# Patient Record
Sex: Male | Born: 1975 | Race: Black or African American | Hispanic: No | Marital: Single | Smoking: Never smoker
Health system: Southern US, Community
[De-identification: ages and names within clinical notes are randomized; demographics above are authoritative.]

## PROBLEM LIST (undated history)

## (undated) DIAGNOSIS — K219 Gastro-esophageal reflux disease without esophagitis: Secondary | ICD-10-CM

## (undated) DIAGNOSIS — G4730 Sleep apnea, unspecified: Secondary | ICD-10-CM

## (undated) DIAGNOSIS — G4733 Obstructive sleep apnea (adult) (pediatric): Secondary | ICD-10-CM

## (undated) DIAGNOSIS — I10 Essential (primary) hypertension: Secondary | ICD-10-CM

## (undated) HISTORY — DX: Sleep apnea, unspecified: G47.30

## (undated) HISTORY — DX: Gastro-esophageal reflux disease without esophagitis: K21.9

---

## 2016-05-09 ENCOUNTER — Emergency Department (HOSPITAL_COMMUNITY)
Admission: EM | Admit: 2016-05-09 | Discharge: 2016-05-09 | Disposition: A | Discharge status: Home / Self Care | Payer: PRIVATE HEALTH INSURANCE | Attending: Emergency Medicine | Admitting: Emergency Medicine

## 2016-05-09 ENCOUNTER — Encounter (HOSPITAL_COMMUNITY): Payer: Self-pay | Admitting: Emergency Medicine

## 2016-05-09 DIAGNOSIS — J020 Streptococcal pharyngitis: Secondary | ICD-10-CM | POA: Insufficient documentation

## 2016-05-09 DIAGNOSIS — R52 Pain, unspecified: Secondary | ICD-10-CM

## 2016-05-09 DIAGNOSIS — I10 Essential (primary) hypertension: Secondary | ICD-10-CM | POA: Diagnosis not present

## 2016-05-09 DIAGNOSIS — M791 Myalgia: Secondary | ICD-10-CM | POA: Insufficient documentation

## 2016-05-09 DIAGNOSIS — R509 Fever, unspecified: Secondary | ICD-10-CM | POA: Diagnosis present

## 2016-05-09 HISTORY — DX: Essential (primary) hypertension: I10

## 2016-05-09 LAB — RAPID STREP SCREEN (MED CTR MEBANE ONLY): STREPTOCOCCUS, GROUP A SCREEN (DIRECT): POSITIVE — AB

## 2016-05-09 LAB — MONONUCLEOSIS SCREEN: Mono Screen: NEGATIVE

## 2016-05-09 MED ORDER — PENICILLIN G BENZATHINE 1200000 UNIT/2ML IM SUSP
1.2000 10*6.[IU] | Freq: Once | INTRAMUSCULAR | Status: AC
  Administered 2016-05-09: 1.2 10*6.[IU] via INTRAMUSCULAR
  Filled 2016-05-09: qty 2

## 2016-05-09 MED ORDER — KETOROLAC TROMETHAMINE 60 MG/2ML IM SOLN
60.0000 mg | Freq: Once | INTRAMUSCULAR | Status: AC
Start: 2016-05-09 — End: 2016-05-09
  Administered 2016-05-09: 60 mg via INTRAMUSCULAR
  Filled 2016-05-09: qty 2

## 2016-05-09 MED ORDER — NAPROXEN 500 MG PO TABS: 500.0000 mg | ORAL_TABLET | Freq: Two times a day (BID) | ORAL | Status: DC

## 2016-05-09 MED ORDER — ACETAMINOPHEN 500 MG PO TABS
500.0000 mg | ORAL_TABLET | Freq: Once | ORAL | Status: DC
  Filled 2016-05-09: qty 1

## 2016-05-09 MED ORDER — IBUPROFEN 200 MG PO TABS: 600.0000 mg | ORAL_TABLET | Freq: Once | ORAL | Status: DC

## 2016-05-09 NOTE — ED Notes (Signed)
Pt sts fever and generalized body aches with sore throat x 2 days

## 2016-05-09 NOTE — Discharge Instructions (Signed)
You have been seen today for a sore throat. Your strep analysis was positive. You were given an injection of penicillin here in the ED to treat this infection. Drink plenty of fluids and get plenty of rest. You should be drinking at least a liter of water an hour to stay hydrated. Ibuprofen or Tylenol for pain or fever. Warm liquids or Chloraseptic spray may help soothe the sore throat. Follow up with PCP as soon as possible. Refer to the resource phone number and website on this discharge paperwork. Return to ED should symptoms worsen.

## 2016-05-09 NOTE — ED Provider Notes (Signed)
CSN: [id number]     Arrival date & time 05/09/16  0849 History   First MD Initiated Contact with Patient 05/09/16 [phone]     Chief Complaint  Patient presents with  . Fever  . Generalized Body Aches     (Consider location/radiation/quality/duration/timing/severity/associated sxs/prior Treatment) HPI   Nathaniel Daugherty is a 40 y.o. male, with a history of Hypertension, presenting to the ED with subjective fever, sore throat, and generalized body aches since yesterday. Also complains of bilateral generalized headache, throbbing, moderate, nonradiating. Has taken DayQuil with some relief. Denies N/V, difficulty breathing or inability to swallow, abdominal pain, or any olther complaints.   Past Medical History  Diagnosis Date  . Hypertension    History reviewed. No pertinent past surgical history. History reviewed. No pertinent family history. Social History  Substance Use Topics  . Smoking status: Never Smoker   . Smokeless tobacco: None  . Alcohol Use: No    Review of Systems  Constitutional: Positive for fever (subjective) and chills.  HENT: Positive for sore throat.   Respiratory: Negative for cough and shortness of breath.   Cardiovascular: Negative for chest pain.  Gastrointestinal: Negative for nausea, vomiting, abdominal pain, diarrhea and constipation.  Musculoskeletal: Positive for myalgias (generalized).  Skin: Negative for color change and pallor.  Neurological: Positive for headaches. Negative for dizziness, syncope, weakness, light-headedness and numbness.  All other systems reviewed and are negative.    Allergies  Review of patient's allergies indicates no known allergies.  Home Medications   Prior to Admission medications   Medication Sig Start Date End Date Taking? Authorizing Provider  calcium carbonate (TUMS - DOSED IN MG ELEMENTAL CALCIUM) 500 MG chewable tablet Chew 2 tablets by mouth daily as needed for indigestion or heartburn.   Yes Historical  Provider, MD  Nutritional Supplements (COLD AND FLU PO) Take 1 tablet by mouth every 6 (six) hours as needed (fever).   Yes Historical Provider, MD  naproxen (NAPROSYN) 500 MG tablet Take 1 tablet (500 mg total) by mouth 2 (two) times daily. 05/09/16   Taleeya Blondin C Saniyya Gau, PA-C   BP 146/104 mmHg  Pulse 100  Temp(Src) 99.3 F (37.4 C) (Oral)  Resp 18  SpO2 97% Physical Exam  Constitutional: He is oriented to person, place, and time. He appears well-developed and well-nourished. No distress.  HENT:  Head: Normocephalic and atraumatic.  Eyes: Conjunctivae are normal.  Neck: Normal range of motion. Neck supple.  Tender cervical lymph nodes. Swallow is intact.  Cardiovascular: Normal rate, regular rhythm, normal heart sounds and intact distal pulses.   Pulmonary/Chest: Effort normal and breath sounds normal. No respiratory distress.  Abdominal: Soft. There is no tenderness. There is no guarding.  Musculoskeletal: He exhibits no edema.  Full ROM in all extremities and spine. No paraspinal tenderness.   Lymphadenopathy:    He has cervical adenopathy.  Neurological: He is alert and oriented to person, place, and time. He has normal reflexes.  No sensory deficits. Strength 5/5 in all extremities. No gait disturbance. Coordination intact. Cranial nerves III-XII grossly intact. No facial droop.   Skin: Skin is warm and dry. He is not diaphoretic.  Psychiatric: He has a normal mood and affect. His behavior is normal.  Nursing note and vitals reviewed.   ED Course  Procedures (including critical care time) Labs Review Labs Reviewed  RAPID STREP SCREEN (NOT AT [facility]) - Abnormal; Notable for the following:    Streptococcus, Group A Screen (Direct) POSITIVE (*)    All other  components within normal limits  MONONUCLEOSIS SCREEN    I have personally reviewed and evaluated these lab results as part of my medical decision-making.   EKG Interpretation None       Medications  acetaminophen (TYLENOL)  tablet 500 mg (500 mg Oral Refused 05/09/16 1221)  ketorolac (TORADOL) injection 60 mg (60 mg Intramuscular Given 05/09/16 1027)  penicillin g benzathine (BICILLIN LA) 1200000 UNIT/2ML injection 1.2 Million Units (1.2 Million Units Intramuscular Given 05/09/16 1221)    MDM   Final diagnoses:  Body aches  Strep throat    Nathaniel Daugherty presents with fever, body aches, and sore throat since yesterday.  Patient is nontoxic appearing, afebrile, not tachycardic upon my exam, not tachypneic, not hypotensive, maintains SPO2 of 97% on room air, and is in no apparent distress. Patient has no signs of sepsis or other serious or life-threatening condition. Patient able to pass an oral fluid challenge. Upon reevaluation, patient voices significant improvement following the Toradol, including decrease in the patient's throat pain, body aches, and relief of headache. Rapid strep is positive. Patient opted for IM penicillin. Home care and return precautions discussed. Patient voiced understanding of these instructions and is comfortable with discharge.  Patient's hypertension is noted. When patient was asked about this, he states that he moved to this area a year ago and has not yet established care with a PCP. Patient was previously prescribed clonidine, but has not taken this medication for at least a year. No symptoms of hypertensive emergency.   Filed Vitals:   05/09/16 0858 05/09/16 0930 05/09/16 0931 05/09/16 0945  BP: 155/115 158/112  146/104  Pulse: 107  98 100  Temp: 99.3 F (37.4 C)     TempSrc: Oral     Resp: 18   18  SpO2: 97%  97% 97%   Filed Vitals:   05/09/16 0931 05/09/16 0945 05/09/16 1145 05/09/16 1148  BP:  146/104 143/94 143/94  Pulse: 98 100 118 115  Temp:    100.5 F (38.1 C)  TempSrc:    Oral  Resp:  18  20  SpO2: 97% 97% 98% 97%     Anselm PancoastShawn C Tanish Prien, PA-C 05/09/16 1608  Marily MemosJason Mesner, MD 05/09/16 1702

## 2016-12-28 ENCOUNTER — Encounter (HOSPITAL_COMMUNITY): Payer: Self-pay | Admitting: Emergency Medicine

## 2016-12-28 ENCOUNTER — Emergency Department (HOSPITAL_COMMUNITY)
Admission: EM | Admit: 2016-12-28 | Discharge: 2016-12-28 | Disposition: A | Discharge status: Home / Self Care | Payer: Managed Care, Other (non HMO) | Attending: Emergency Medicine | Admitting: Emergency Medicine

## 2016-12-28 DIAGNOSIS — M7522 Bicipital tendinitis, left shoulder: Secondary | ICD-10-CM | POA: Diagnosis not present

## 2016-12-28 DIAGNOSIS — M25512 Pain in left shoulder: Secondary | ICD-10-CM | POA: Diagnosis present

## 2016-12-28 DIAGNOSIS — M7552 Bursitis of left shoulder: Secondary | ICD-10-CM | POA: Diagnosis not present

## 2016-12-28 DIAGNOSIS — I10 Essential (primary) hypertension: Secondary | ICD-10-CM | POA: Diagnosis not present

## 2016-12-28 MED ORDER — METHOCARBAMOL 500 MG PO TABS: 500.0000 mg | ORAL_TABLET | Freq: Two times a day (BID) | ORAL | 0 refills | Status: DC

## 2016-12-28 MED ORDER — NAPROXEN 500 MG PO TABS: 500.0000 mg | ORAL_TABLET | Freq: Two times a day (BID) | ORAL | 0 refills | Status: DC

## 2016-12-28 NOTE — ED Notes (Signed)
Applied sling to pts left arm.

## 2016-12-28 NOTE — ED Triage Notes (Signed)
PT did not take meds for BP today

## 2016-12-28 NOTE — ED Provider Notes (Signed)
[facility]-EMERGENCY DEPT Provider Note    By signing my name below, I, Earmon PhoenixJennifer Waddell, attest that this documentation has been prepared under the direction and in the presence of Sharen Hecklaudia Deatrice Spanbauer, PA-C. Electronically Signed: Earmon PhoenixJennifer Waddell, ED Scribe. 12/28/16. 10:49 AM.    History   Chief Complaint Chief Complaint  Patient presents with  . Shoulder Pain   The history is provided by the patient and medical records. No language interpreter was used.    Nathaniel Daugherty is a 41 y.o. male with PMHx of HTN who presents to the Emergency Department complaining of left shoulder tightness that began yesterday evening. He reports associated left sided neck soreness and tightness. Pt reports the pain starts in his left neck and radiates down to mid bicep of the LUE. He reports lifting things over his head frequently at work. He has taken Tylenol for pain relief and reports rubbing alcohol on it last night. Moving the LUE increases the pain. He denies alleviating factors. He denies numbness, tingling or weakness of the left extremity. He denies trauma, injury or fall. He is right hand dominant.   Past Medical History:  Diagnosis Date  . Hypertension     There are no active problems to display for this patient.   History reviewed. No pertinent surgical history.     Home Medications    Prior to Admission medications   Medication Sig Start Date End Date Taking? Authorizing Provider  calcium carbonate (TUMS - DOSED IN MG ELEMENTAL CALCIUM) 500 MG chewable tablet Chew 2 tablets by mouth daily as needed for indigestion or heartburn.    Historical Provider, MD  methocarbamol (ROBAXIN) 500 MG tablet Take 1 tablet (500 mg total) by mouth 2 (two) times daily. 12/28/16   Liberty Handylaudia J Eknoor Novack, PA-C  naproxen (NAPROSYN) 500 MG tablet Take 1 tablet (500 mg total) by mouth 2 (two) times daily. 12/28/16   Liberty Handylaudia J Jairus Tonne, PA-C  Nutritional Supplements (COLD AND FLU PO) Take 1 tablet by mouth every 6 (six)  hours as needed (fever).    Historical Provider, MD    Family History No family history on file.  Social History Social History  Substance Use Topics  . Smoking status: Never Smoker  . Smokeless tobacco: Never Used  . Alcohol use No     Allergies   Patient has no known allergies.   Review of Systems Review of Systems  Constitutional: Negative for chills and fever.  HENT: Negative for congestion and sore throat.   Eyes: Negative for visual disturbance.  Respiratory: Negative for cough, chest tightness and shortness of breath.   Cardiovascular: Negative for chest pain.  Gastrointestinal: Negative for abdominal pain, constipation, diarrhea, nausea and vomiting.  Genitourinary: Negative for decreased urine volume and difficulty urinating.  Musculoskeletal: Positive for arthralgias and myalgias. Negative for joint swelling.  Skin: Negative for rash.  Neurological: Negative for dizziness, weakness, light-headedness, numbness and headaches.     Physical Exam Updated Vital Signs BP (!) 162/102 (BP Location: Right Arm)   Pulse 67   Temp 98.7 F (37.1 C) (Oral)   Resp 16   SpO2 97%   Physical Exam  Constitutional: He is oriented to person, place, and time. He appears well-developed and well-nourished. No distress.  NAD.  HENT:  Head: Normocephalic and atraumatic.  Right Ear: External ear normal.  Left Ear: External ear normal.  Moist mucous membranes.  No nasal mucosa edema. Oropharynx and tonsils pink without erythema, edema, exudates or lesions.  Uvula midline. No trismus.  Eyes: Conjunctivae are normal. No scleral icterus.  Neck: Normal range of motion. Neck supple. No JVD present.  Cardiovascular: Normal rate and intact distal pulses.   Pulmonary/Chest: Effort normal. No respiratory distress.  Musculoskeletal: Normal range of motion. He exhibits tenderness. He exhibits no edema or deformity.  Increased trapezial muscular tone bilaterally.  No tenderness at [facility]  joint, clavicle.  Tenderness at left [facility] joint, left deltoid, left bicep tendon groove and subacromial bursa. Positive Hawkins test. Positive Neer test. Positive Speed's test.  Positive empty can test. Negative lift off test. Negative arm drop test. Negative O'brien test. Full ROM of shoulder with reported pain with flexion past shoulder level and abduction.  Neurological: He is alert and oriented to person, place, and time.  5/5 strength with shoulder raise, abduction and adduction, bilaterally.  5/5 strength with elbow flexion and extension, bilaterally.  5/5 strength with wrist flexion and extension.  5/5 strength with finger abduction 2/4 biceps, triceps and BR DTR bilaterally. Good pincer and hand grip bilaterally.  Sensation to light touch intact in median, ulnar and radial nerve distribution, bilaterally.   Skin: Skin is warm and dry. Capillary refill takes less than 2 seconds.  Psychiatric: He has a normal mood and affect. His behavior is normal. Judgment and thought content normal.  Nursing note and vitals reviewed.    ED Treatments / Results  DIAGNOSTIC STUDIES: Oxygen Saturation is 97% on RA, normal by my interpretation.   COORDINATION OF CARE: 10:41 AM- Will provide work note and sling. Encourage pt to rest and ice the left shoulder. Will prescribe muscle relaxer and encouraged NSAID. Pt verbalizes understanding and agrees to plan.  Medications - No data to display  Labs (all labs ordered are listed, but only abnormal results are displayed) Labs Reviewed - No data to display  EKG  EKG Interpretation None       Radiology No results found.  Procedures Procedures (including critical care time)  Medications Ordered in ED Medications - No data to display   Initial Impression / Assessment and Plan / ED Course  I have reviewed the triage vital signs and the nursing notes.  Pertinent labs & imaging results that were available during my care of the patient were  reviewed by me and considered in my medical decision making (see chart for details).     Patient here for left shoulder tightness that began yesterday evening. Informed pt that imaging is not indicated at this time due to there being no trauma, injury or fall.  Doubt bony injury or dislocation.  Exam remarkable for tenderness at left [facility] joint, left deltoid, left bicep tendon groove and subacromial bursa. Left shoulder remarkable for Positive Hawkins test. Positive Neer test. Positive Speed's test.  Positive empty can test. Negative lift off test. Negative arm drop test. Negative O'brien test. Full ROM of shoulder with reported pain with flexion past shoulder level and abduction. Explained to pt that his symptoms are likely due to a subacromial bursitis and/or possibly bicep tendonitis. Pt advised to follow up with orthopedics for continued symptoms. Conservative therapy recommended and discussed which includes resting, immobilization and icing the area. Will provide pt with a sling and prescription for muscle relaxer and NSAID. Patient will be discharged home & is agreeable with above plan. Returns precautions discussed. Pt appears safe for discharge.  I personally performed the services described in this documentation, which was scribed in my presence. The recorded information has been reviewed and is accurate.   Final Clinical  Impressions(s) / ED Diagnoses   Final diagnoses:  Acute pain of left shoulder  Biceps tendinitis of left upper extremity  Subacromial bursitis of left shoulder joint    New Prescriptions Discharge Medication List as of 12/28/2016 11:02 AM       Liberty Handy, PA-C 12/28/16 1220    Doug Sou, MD 12/28/16 1717

## 2016-12-28 NOTE — ED Triage Notes (Signed)
Pt had full range of motion of Lt shoulder while removing his jacket. Pt denies any injury ,fallsand he does not lift heavy objects at work. Pt also reports does not do any weight lifting.

## 2016-12-28 NOTE — Discharge Instructions (Signed)
Your symptoms are most consistent with subacromial bursitis (inflammation of bursa of shoulder) or bicep tendonitis (inflammation of bicep tendon).  This occurs due to repetitive over head arm motion.  The most important initial treatment is to decrease inflammation: take naproxen twice a day, ice, rest.  After 2-3 days of rest, you may slowly transition to daily activities, but you should still avoid overhead arm motions for 2-3 weeks to prevent further inflammation of the joint. You may use your sling to assist you in resting your joint.   Follow up with orthopedist in 2-4 weeks for further discussion of your symptoms and outpatient imaging and further treatment. Please follow up with orthopedist if your shoulder pain does not improve after 2 weeks of naproxen, ice and activity modification  Return to emergency department if you develop left hand numbness, weakness, tingling.

## 2016-12-28 NOTE — ED Triage Notes (Signed)
Pt. Stated, My left shoulder started hurting yesterday. No injury.

## 2017-12-09 ENCOUNTER — Other Ambulatory Visit: Payer: Self-pay

## 2017-12-09 ENCOUNTER — Emergency Department (HOSPITAL_COMMUNITY)
Admission: EM | Admit: 2017-12-09 | Discharge: 2017-12-09 | Discharge status: Against Medical Advice | Payer: PRIVATE HEALTH INSURANCE

## 2019-01-06 ENCOUNTER — Emergency Department (HOSPITAL_COMMUNITY)
Admission: EM | Admit: 2019-01-06 | Discharge: 2019-01-06 | Disposition: A | Discharge status: Home / Self Care | Payer: PRIVATE HEALTH INSURANCE | Attending: Emergency Medicine | Admitting: Emergency Medicine

## 2019-01-06 ENCOUNTER — Other Ambulatory Visit: Payer: Self-pay

## 2019-01-06 ENCOUNTER — Encounter (HOSPITAL_COMMUNITY): Payer: Self-pay

## 2019-01-06 DIAGNOSIS — I10 Essential (primary) hypertension: Secondary | ICD-10-CM | POA: Insufficient documentation

## 2019-01-06 DIAGNOSIS — Z79899 Other long term (current) drug therapy: Secondary | ICD-10-CM | POA: Insufficient documentation

## 2019-01-06 LAB — COMPREHENSIVE METABOLIC PANEL
ALBUMIN: 4.5 g/dL (ref 3.5–5.0)
ALT: 12 U/L (ref 0–44)
AST: 12 U/L — AB (ref 15–41)
Alkaline Phosphatase: 61 U/L (ref 38–126)
Anion gap: 5 (ref 5–15)
BUN: 14 mg/dL (ref 6–20)
CHLORIDE: 109 mmol/L (ref 98–111)
CO2: 27 mmol/L (ref 22–32)
CREATININE: 1.02 mg/dL (ref 0.61–1.24)
Calcium: 8.8 mg/dL — ABNORMAL LOW (ref 8.9–10.3)
GFR calc non Af Amer: >60 mL/min (ref >60)
Glucose, Bld: 100 mg/dL — ABNORMAL HIGH (ref 70–99)
Potassium: 3.8 mmol/L (ref 3.5–5.1)
SODIUM: 141 mmol/L (ref 135–145)
Total Bilirubin: 0.9 mg/dL (ref 0.3–1.2)
Total Protein: 7.8 g/dL (ref 6.5–8.1)

## 2019-01-06 LAB — CBC WITH DIFFERENTIAL/PLATELET
ABS IMMATURE GRANULOCYTES: 0.01 10*3/uL (ref 0.00–0.07)
BASOS ABS: 0.1 10*3/uL (ref 0.0–0.1)
BASOS PCT: 1 %
Eosinophils Absolute: 0.2 10*3/uL (ref 0.0–0.5)
Eosinophils Relative: 2 %
HCT: 49.6 % (ref 39.0–52.0)
Hemoglobin: 15.4 g/dL (ref 13.0–17.0)
IMMATURE GRANULOCYTES: 0 %
Lymphocytes Relative: 43 %
Lymphs Abs: 2.8 10*3/uL (ref 0.7–4.0)
MCH: 30.5 pg (ref 26.0–34.0)
MCHC: 31 g/dL (ref 30.0–36.0)
MCV: 98.2 fL (ref 80.0–100.0)
Monocytes Absolute: 0.6 10*3/uL (ref 0.1–1.0)
Monocytes Relative: 8 %
NEUTROS ABS: 3 10*3/uL (ref 1.7–7.7)
NEUTROS PCT: 46 %
PLATELETS: 250 10*3/uL (ref 150–400)
RBC: 5.05 MIL/uL (ref 4.22–5.81)
RDW: 12.4 % (ref 11.5–15.5)
WBC: 6.6 10*3/uL (ref 4.0–10.5)
nRBC: 0 % (ref 0.0–0.2)

## 2019-01-06 MED ORDER — HYDROCHLOROTHIAZIDE 12.5 MG PO CAPS
12.5000 mg | ORAL_CAPSULE | Freq: Once | ORAL | Status: AC
  Administered 2019-01-06: 12.5 mg via ORAL
  Filled 2019-01-06: qty 1

## 2019-01-06 MED ORDER — AMLODIPINE BESYLATE 5 MG PO TABS: 5.0000 mg | ORAL_TABLET | Freq: Every day | ORAL | 0 refills | Status: DC

## 2019-01-06 MED ORDER — AMLODIPINE BESYLATE 5 MG PO TABS
5.0000 mg | ORAL_TABLET | Freq: Once | ORAL | Status: AC
  Administered 2019-01-06: 5 mg via ORAL
  Filled 2019-01-06: qty 1

## 2019-01-06 MED ORDER — HYDROCHLOROTHIAZIDE 25 MG PO TABS: 25.0000 mg | ORAL_TABLET | Freq: Every day | ORAL | 0 refills | Status: DC

## 2019-01-06 NOTE — Discharge Instructions (Signed)
You have been diagnosed today with High Blood Pressure.  At this time there does not appear to be the presence of an emergent medical condition, however there is always the potential for conditions to change. Please read and follow the below instructions.  Please return to the Emergency Department immediately for any new or worsening symptoms. Please be sure to follow up with your Primary Care Provider within one week regarding your visit today; please call their office to schedule an appointment even if you are feeling better for a follow-up visit. You may begin taking the blood pressure medications amlodipine and hydrochlorothiazide as prescribed once daily to help control your blood pressure. You must establish a primary care provider for further evaluation and treatment of your blood pressure.  I recommend that you have your blood pressure rechecked this week at a primary care office.  I have referred you to [facility] wellness [facility] to establish a primary care provider.  Continued monitoring of your blood pressures and treatment is important to prevent heart attack, stroke, kidney disease and other dangerous diseases.  Get help right away if: You get a very bad headache. You start to feel confused. You feel weak or numb. You feel faint. You get very bad pain in your: Chest. Belly (abdomen). You throw up (vomit) more than once. You have trouble breathing.  Please read the additional information packets attached to your discharge summary.  Do not take your medicine if  develop an itchy rash, swelling in your mouth or lips, or difficulty breathing.

## 2019-01-06 NOTE — ED Provider Notes (Addendum)
[facility]-EMERGENCY DEPT Provider Note   CSN: [id number] Arrival date & time: 01/06/19  1058    History   Chief Complaint Chief Complaint  Patient presents with  . Hypertension    HPI Nathaniel Daugherty is a 43 y.o. male presenting today for hypertension.  Patient reports that [facility] to receive a [facility] for his work's insurance plan when they noticed that his blood pressure was elevated.  Patient without any symptoms or complaints and states that he is feeling well and normal today however he was strongly encouraged to present to the emergency department by [facility] employees for his asymptomatic hypertension.  Patient reports that he has had elevated blood pressure for most of his adult life and had been previously treated prior to moving to [place].  Patient reports that he moved to [place] 3 years ago and has not established a primary care provider or taken any of his blood pressure medications since that time.  Patient does not recall the name of his previous blood pressure medications.  Patient denies other complaints today.    HPI  Past Medical History:  Diagnosis Date  . Hypertension     There are no active problems to display for this patient.   History reviewed. No pertinent surgical history.      Home Medications    Prior to Admission medications   Medication Sig Start Date End Date Taking? Authorizing Provider  amLODipine (NORVASC) 5 MG tablet Take 1 tablet (5 mg total) by mouth daily. 01/06/19   Harlene Salts A, PA-C  hydrochlorothiazide (HYDRODIURIL) 25 MG tablet Take 1 tablet (25 mg total) by mouth daily. 01/06/19   Harlene Salts A, PA-C  methocarbamol (ROBAXIN) 500 MG tablet Take 1 tablet (500 mg total) by mouth 2 (two) times daily. Patient not taking: Reported on 01/06/2019 12/28/16   Liberty Handy, PA-C  naproxen (NAPROSYN) 500 MG tablet Take 1 tablet (500 mg total) by mouth 2  (two) times daily. Patient not taking: Reported on 01/06/2019 12/28/16   Jerrell Mylar    Family History History reviewed. No pertinent family history.  Social History Social History   Tobacco Use  . Smoking status: Never Smoker  . Smokeless tobacco: Never Used  Substance Use Topics  . Alcohol use: No  . Drug use: No     Allergies   Patient has no known allergies.   Review of Systems Review of Systems  Constitutional: Negative.  Negative for chills and fever.  Eyes: Negative.  Negative for visual disturbance.  Respiratory: Negative.  Negative for shortness of breath.   Cardiovascular: Negative.  Negative for chest pain.  Gastrointestinal: Negative.  Negative for abdominal pain, nausea and vomiting.  Genitourinary: Negative.   Musculoskeletal: Negative.  Negative for arthralgias and myalgias.  Neurological: Positive for headaches (Occasional). Negative for dizziness, syncope, weakness and numbness.  All other systems reviewed and are negative.  Physical Exam Updated Vital Signs BP (!) 157/86   Pulse 75   Temp 98.6 F (37 C) (Oral)   Resp 11   SpO2 100%   Physical Exam Constitutional:      General: He is not in acute distress.    Appearance: Normal appearance. He is well-developed. He is not ill-appearing or diaphoretic.  HENT:     Head: Normocephalic and atraumatic.     Jaw: There is normal jaw occlusion.     Right Ear: External ear normal.  Left Ear: External ear normal.     Nose: Nose normal.     Mouth/Throat:     Lips: Pink.     Mouth: Mucous membranes are moist.     Pharynx: Oropharynx is clear. Uvula midline.  Eyes:     General: Vision grossly intact. Gaze aligned appropriately.     Extraocular Movements: Extraocular movements intact.     Conjunctiva/sclera: Conjunctivae normal.     Pupils: Pupils are equal, round, and reactive to light.     Comments: Visual fields grossly intact bilaterally.  Neck:     Musculoskeletal: Full passive  range of motion without pain, normal range of motion and neck supple.     Trachea: Trachea and phonation normal. No tracheal deviation.  Cardiovascular:     Rate and Rhythm: Normal rate and regular rhythm.     Pulses:          Radial pulses are 2+ on the right side and 2+ on the left side.       Dorsalis pedis pulses are 2+ on the right side and 2+ on the left side.       Posterior tibial pulses are 2+ on the right side and 2+ on the left side.     Heart sounds: Normal heart sounds.  Pulmonary:     Effort: Pulmonary effort is normal. No respiratory distress.     Breath sounds: Normal breath sounds and air entry.  Chest:     Chest wall: No tenderness.  Abdominal:     General: There is no distension.     Palpations: Abdomen is soft.     Tenderness: There is no abdominal tenderness. There is no guarding or rebound.  Musculoskeletal: Normal range of motion.     Right lower leg: Normal. He exhibits no tenderness. No edema.     Left lower leg: Normal. He exhibits no tenderness. No edema.  Feet:     Right foot:     Protective Sensation: 3 sites tested. 3 sites sensed.     Left foot:     Protective Sensation: 3 sites tested. 3 sites sensed.  Skin:    General: Skin is warm and dry.     Capillary Refill: Capillary refill takes less than 2 seconds.  Neurological:     Mental Status: He is alert.     GCS: GCS eye subscore is 4. GCS verbal subscore is 5. GCS motor subscore is 6.     Comments: Mental Status: Alert, oriented, thought content appropriate, able to give a coherent history. Speech fluent without evidence of aphasia. Able to follow 2 step commands without difficulty. Cranial Nerves: II: Peripheral visual fields grossly normal, pupils equal, round, reactive to light III,IV, VI: ptosis not present, extra-ocular motions intact bilaterally V,VII: smile symmetric, eyebrows raise symmetric, facial light touch sensation equal VIII: hearing grossly normal to voice X: uvula elevates  symmetrically XI: bilateral shoulder shrug symmetric and strong XII: midline tongue extension without fassiculations Motor: Normal tone. 5/5 strength in upper and lower extremities bilaterally including strong and equal grip strength and dorsiflexion/plantar flexion Sensory: Sensation intact to light touch in all extremities.Negative Romberg.  Deep Tendon Reflexes: 2+ and symmetric in the biceps and patella Cerebellar: normal finger-to-nose with bilateral upper extremities. Normal heel-to -shin balance bilaterally of the lower extremity. No pronator drift.  Gait: normal gait and balance CV: distal pulses palpable throughout  Psychiatric:        Behavior: Behavior normal.    ED Treatments / Results  Labs (all labs ordered are listed, but only abnormal results are displayed) Labs Reviewed  COMPREHENSIVE METABOLIC PANEL - Abnormal; Notable for the following components:      Result Value   Glucose, Bld 100 (*)    Calcium 8.8 (*)    AST 12 (*)    All other components within normal limits  CBC WITH DIFFERENTIAL/PLATELET    EKG EKG Interpretation  Date/Time:  Wednesday January 06 2019 15:50:01 EST Ventricular Rate:  61 PR Interval:    QRS Duration: 90 QT Interval:  416 QTC Calculation: 419 R Axis:   38 Text Interpretation:  Sinus arrhythmia Probable left atrial enlargement Probable left ventricular hypertrophy No acute changes Confirmed by Derwood Kaplan [id number]) on 01/06/2019 5:13:12 PM   Radiology No results found.  Procedures Procedures (including critical care time)  Medications Ordered in ED Medications  amLODipine (NORVASC) tablet 5 mg (5 mg Oral Given 01/06/19 1544)  hydrochlorothiazide (MICROZIDE) capsule 12.5 mg (12.5 mg Oral Given 01/06/19 1735)     Initial Impression / Assessment and Plan / ED Course  I have reviewed the triage vital signs and the nursing notes.  Pertinent labs & imaging results that were available during my care of the patient were  reviewed by me and considered in my medical decision making (see chart for details).    44 year old male presenting today from urgent care clinic for hypertension.  Patient was there for his annual work physical with a noted him to be hypertensive.  Patient without complaints and states that he has been feeling well.  Review of symptoms reveals a patient has occasional headache however he is currently without headache.  Patient denies chest pain or shortness of breath.  No visual complaints.  Physical examination is reassuring and neuro examination is without deficit.  Case discussed with Dr. Estell Harpin who advises CBC, CMP and EKG.  Amlodipine 5 mg ordered. - CBC within normal limits CMP nonacute EKG without acute findings reviewed with Dr. Rhunette Croft. - Case discussed with Dr. Rhunette Croft who is now attending physician at shift change.  He agrees that patient may be discharged with outpatient follow-up at this time.  We plan to add hydrochlorothiazide 25 mg daily on top of patient's 5 mg of amlodipine.  We discussed plan of care with the patient and he is agreeable to this and to discharge at this time.  On reassessment he is without complaint, no headache, chest pain or shortness of breath and he states that he is feeling well.  Patient has been referred to [facility] for primary care provider.  Patient states that he plans to follow-up with a primary care provider.  At this time there does not appear to be any evidence of an acute emergency medical condition and the patient appears stable for discharge with appropriate outpatient follow up. Diagnosis was discussed with patient who verbalizes understanding of care plan and is agreeable to discharge. I have discussed return precautions with patient who verbalizes understanding of return precautions. Patient strongly encouraged to follow-up with their PCP. All questions answered.  Patient's case discussed with Dr. Rhunette Croft who agrees with  plan to discharge with follow-up.   Note: Portions of this report may have been transcribed using voice recognition software. Every effort was made to ensure accuracy; however, inadvertent computerized transcription errors may still be present. Final Clinical Impressions(s) / ED Diagnoses   Final diagnoses:  Asymptomatic hypertension    ED Discharge Orders         Ordered  amLODipine (NORVASC) 5 MG tablet  Daily     01/06/19 1800    hydrochlorothiazide (HYDRODIURIL) 25 MG tablet  Daily     01/06/19 1800           Bill Salinas, PA-C 01/06/19 1809    Bill Salinas, PA-C 01/06/19 1815    Derwood Kaplan, MD 01/07/19 [phone]

## 2019-01-06 NOTE — ED Triage Notes (Signed)
[facility]. Pt states that he was told to come here because BP was 170/130. Pt denies headache or any pain. Pt denies weakness/numbness. Pt presents with no neuro deficits. Pt reports that he has HTN and has not taken BP medication for about 3 years now. Pt reports not having a PCP here since he moved about 3 years ago.

## 2019-01-14 ENCOUNTER — Ambulatory Visit: Payer: Self-pay | Admitting: *Deleted

## 2019-01-14 NOTE — Telephone Encounter (Signed)
Pt calling with complaints of increased BP reading of 156/122 that was taken earlier today around 12 pm. Pt reports that BP while on the phone with triage nurse was 148/118. Pt denies having any symptoms at this time. Pt was seen in the ED on 2/26 and prescribed Amlodipine and Hydrochlorothiazide which he has been taking daily without missing any doses. Pt also complains of his throat itching and experiencing coughing with taking the medications but is unable to tell which medication is causing these symptoms due to taking them at the same time. No appt availability for the pt in the office on tomorrow. Pt advised to seek treatment in the ED/Urgent Care with worsening symptoms before scheduled appt on Monday, 01/18/19. Pt verbalized understanding.   Reason for Disposition . Systolic BP  >= 160 OR Diastolic >= 100  Answer Assessment - Initial Assessment Questions 1. BLOOD PRESSURE: "What is the blood pressure?" "Did you take at least two measurements 5 minutes apart?"     156/122 around 12 pm today and 148/118 while on the phone with triage nurse, [id number] 2. ONSET: "When did you take your blood pressure?"     Around 12 pm today and while on the phone with triage nurse 3. HOW: "How did you obtain the blood pressure?" (e.g., visiting nurse, automatic home BP monitor)     Automatic home monitor 4. HISTORY: "Do you have a history of high blood pressure?"     yes 5. MEDICATIONS: "Are you taking any medications for blood pressure?" "Have you missed any doses recently?"     Yes was given to him by the emergency room 6. OTHER SYMPTOMS: "Do you have any symptoms?" (e.g., headache, chest pain, blurred vision, difficulty breathing, weakness)     Sometimes shortness of breath  Protocols used: HIGH BLOOD PRESSURE-A-AH

## 2019-01-18 ENCOUNTER — Ambulatory Visit: Payer: No Typology Code available for payment source | Admitting: Physician Assistant

## 2019-01-18 ENCOUNTER — Encounter: Payer: Self-pay | Admitting: Physician Assistant

## 2019-01-18 VITALS — BP 148/100 | HR 88 | Temp 98.8°F | Ht 68.25 in | Wt 224.0 lb

## 2019-01-18 DIAGNOSIS — E669 Obesity, unspecified: Secondary | ICD-10-CM | POA: Diagnosis not present

## 2019-01-18 DIAGNOSIS — Z114 Encounter for screening for human immunodeficiency virus [HIV]: Secondary | ICD-10-CM | POA: Diagnosis not present

## 2019-01-18 DIAGNOSIS — R0681 Apnea, not elsewhere classified: Secondary | ICD-10-CM | POA: Diagnosis not present

## 2019-01-18 DIAGNOSIS — R252 Cramp and spasm: Secondary | ICD-10-CM

## 2019-01-18 DIAGNOSIS — I10 Essential (primary) hypertension: Secondary | ICD-10-CM

## 2019-01-18 LAB — BASIC METABOLIC PANEL
BUN: 21 mg/dL (ref 6–23)
CHLORIDE: 99 meq/L (ref 96–112)
CO2: 30 meq/L (ref 19–32)
CREATININE: 1.21 mg/dL (ref 0.40–1.50)
Calcium: 9.6 mg/dL (ref 8.4–10.5)
GFR: 80.25 mL/min (ref >60.00)
Glucose, Bld: 96 mg/dL (ref 70–99)
Potassium: 4.2 mEq/L (ref 3.5–5.1)
SODIUM: 136 meq/L (ref 135–145)

## 2019-01-18 MED ORDER — AMLODIPINE BESYLATE 10 MG PO TABS
10.0000 mg | ORAL_TABLET | Freq: Every day | ORAL | 0 refills | Status: DC
Start: 2019-01-18 — End: 2019-02-01

## 2019-01-18 MED ORDER — HYDROCHLOROTHIAZIDE 25 MG PO TABS: 25.0000 mg | ORAL_TABLET | Freq: Every day | ORAL | 0 refills | Status: DC

## 2019-01-18 NOTE — Patient Instructions (Addendum)
It was great to see you!  Increase Norvasc to 10 mg daily. Continue Hydrochlorothiazide 25 mg.  We will be in touch with lab results.  You will be contacted about your referral to the sleep study specialists.  Let's follow-up in 2 weeks, sooner if you have concerns.  Take care,  Jarold Motto PA-C

## 2019-01-18 NOTE — Progress Notes (Signed)
Nathaniel Daugherty is a 43 y.o. male here for a new problem.   History of Present Illness:   Chief Complaint  Patient presents with  . Establish Care  . Hypertension  . Shortness of Breath    When sleeping    HPI   HTN -- Has been dx several years ago.  Up until an ED visit at the end of February, he has been without his medications for quite some time.  He does not remember what he used to take.  The emergency department provider started him on Norvasc 5 mg and hydrochlorothiazide 25 mg daily.  Several family members have history of hypertension. Currently taking Norvasc 5 mg, HCTZ 25 mg. Patient denies chest pain, SOB, blurred vision, dizziness, unusual headaches, lower leg swelling. Patient is compliant with medication. Denies excessive caffeine intake, stimulant usage, excessive alcohol intake, or increase in salt consumption.  Patient does report that since starting the HCTZ and Norvasc regimen he has had some intermittent muscle cramps.  BP Readings from Last 3 Encounters:  01/18/19 (!) 148/100  01/06/19 (!) 157/86  12/28/16 (!) 157/105   Apnea --patient states that his girlfriend has told him several times that he has severe issues of gasping for air in the middle of sleeping and taking pauses of no breathing.  She states that this is getting worse with time.  Patient states his father has sleep apnea, however he is noncompliant with a CPAP.  Body mass index is 33.81 kg/m.   Past Medical History:  Diagnosis Date  . Hypertension      Social History   Socioeconomic History  . Marital status: Single    Spouse name: Not on file  . Number of children: 4  . Years of education: Not on file  . Highest education level: Not on file  Occupational History  . Not on file  Social Needs  . Financial resource strain: Not on file  . Food insecurity:    Worry: Not on file    Inability: Not on file  . Transportation needs:    Medical: Not on file    Non-medical: Not on file   Tobacco Use  . Smoking status: Never Smoker  . Smokeless tobacco: Never Used  Substance and Sexual Activity  . Alcohol use: No  . Drug use: No  . Sexual activity: Yes    Birth control/protection: Condom  Lifestyle  . Physical activity:    Days per week: Not on file    Minutes per session: Not on file  . Stress: Not on file  Relationships  . Social connections:    Talks on phone: Not on file    Gets together: Not on file    Attends religious service: Not on file    Active member of club or organization: Not on file    Attends meetings of clubs or organizations: Not on file    Relationship status: Not on file  . Intimate partner violence:    Fear of current or ex partner: Not on file    Emotionally abused: Not on file    Physically abused: Not on file    Forced sexual activity: Not on file  Other Topics Concern  . Not on file  Social History Narrative  . Not on file    History reviewed. No pertinent surgical history.  Family History  Problem Relation Age of Onset  . Prostate cancer Neg Hx   . Colon cancer Neg Hx     No Known  Allergies  Current Medications:   Current Outpatient Medications:  .  hydrochlorothiazide (HYDRODIURIL) 25 MG tablet, Take 1 tablet (25 mg total) by mouth daily., Disp: 30 tablet, Rfl: 0 .  amLODipine (NORVASC) 10 MG tablet, Take 1 tablet (10 mg total) by mouth daily., Disp: 30 tablet, Rfl: 0   Review of Systems:   Review of Systems  Constitutional: Negative for chills, fever, malaise/fatigue and weight loss.  Respiratory: Negative for shortness of breath.   Cardiovascular: Negative for chest pain, orthopnea, claudication and leg swelling.  Gastrointestinal: Negative for heartburn, nausea and vomiting.  Neurological: Negative for dizziness, tingling and headaches.    Vitals:   Vitals:   01/18/19 1100 01/18/19 1126  BP: (!) 138/98 (!) 148/100  Pulse: 88   Temp: 98.8 F (37.1 C)   TempSrc: Oral   SpO2: 98%   Weight: 224 lb (101.6  kg)   Height: 5' 8.25" (1.734 m)      Body mass index is 33.81 kg/m.  Physical Exam:   Physical Exam Vitals signs and nursing note reviewed.  Constitutional:      General: He is not in acute distress.    Appearance: He is well-developed. He is not ill-appearing or toxic-appearing.  Cardiovascular:     Rate and Rhythm: Normal rate and regular rhythm.     Pulses: Normal pulses.     Heart sounds: Normal heart sounds, S1 normal and S2 normal.     Comments: No LE edema Pulmonary:     Effort: Pulmonary effort is normal.     Breath sounds: Normal breath sounds.  Skin:    General: Skin is warm and dry.  Neurological:     Mental Status: He is alert.     GCS: GCS eye subscore is 4. GCS verbal subscore is 5. GCS motor subscore is 6.  Psychiatric:        Speech: Speech normal.        Behavior: Behavior normal. Behavior is cooperative.      Assessment and Plan:   Nur was seen today for establish care, hypertension and shortness of breath.  Diagnoses and all orders for this visit:  Muscle cramps We will check BMP, as he recently started HCTZ. -     Basic metabolic panel  Obesity, unspecified classification, unspecified obesity type, unspecified whether serious comorbidity present; Episode of apnea Refer for sleep study.  Screening for HIV (human immunodeficiency virus) -     HIV Antibody (routine testing w rflx)  Essential hypertension Uncontrolled.  Going to increase Norvasc from 5 to 10 mg daily.  Continue hydrochlorothiazide 25 mg daily.  Follow-up in 2 weeks, sooner if issues.  Referral for patient to have sleep study is prudent.  Other orders -     amLODipine (NORVASC) 10 MG tablet; Take 1 tablet (10 mg total) by mouth daily. -     hydrochlorothiazide (HYDRODIURIL) 25 MG tablet; Take 1 tablet (25 mg total) by mouth daily.  . Reviewed expectations re: course of current medical issues. . Discussed self-management of symptoms. . Outlined signs and symptoms  indicating need for more acute intervention. . Patient verbalized understanding and all questions were answered. . See orders for this visit as documented in the electronic medical record. . Patient received an After-Visit Summary.  Jarold Motto, PA-C

## 2019-01-19 LAB — HIV ANTIBODY (ROUTINE TESTING W REFLEX): HIV 1&2 Ab, 4th Generation: NONREACTIVE

## 2019-01-30 ENCOUNTER — Telehealth: Payer: Self-pay

## 2019-01-30 NOTE — Telephone Encounter (Signed)
Called patient he to change app due to new restrictions. Everything I have found says that you can't do visits on cell phone. He does not have access to computer at this time. I have verified his email and he  Will call if that changes.  His BP reading while on the phone was 140/105 pulse was 99. He states that this is his average readings. He normally takes his BP meds at 7pm. He does not take in the morning because he frequently for gets to take them when he tries. He has taken his bp meds every day.   Review: taking medications as instructed, no medication side effects noted, no TIAs, no chest pain on exertion, no dyspnea on exertion, no swelling of ankles. Smoker: No. He has had some cramp in legs recently. No changes in vision.  BP Readings from Last 3 Encounters:  01/18/19 (!) 148/100  01/06/19 (!) 157/86  12/28/16 (!) 157/105   Lab Results  Component Value Date   CREATININE 1.21 01/18/2019   CREATININE 1.02 01/06/2019     Not sure if this is ok to just call in 35mo of refills and have him move his app out or not.

## 2019-02-01 ENCOUNTER — Other Ambulatory Visit: Payer: Self-pay

## 2019-02-01 ENCOUNTER — Ambulatory Visit: Payer: No Typology Code available for payment source | Admitting: Physician Assistant

## 2019-02-01 ENCOUNTER — Encounter: Payer: Self-pay | Admitting: Physician Assistant

## 2019-02-01 VITALS — BP 124/90 | HR 90 | Temp 98.2°F | Ht 68.25 in | Wt 230.0 lb

## 2019-02-01 DIAGNOSIS — I10 Essential (primary) hypertension: Secondary | ICD-10-CM | POA: Diagnosis not present

## 2019-02-01 DIAGNOSIS — Z23 Encounter for immunization: Secondary | ICD-10-CM | POA: Diagnosis not present

## 2019-02-01 MED ORDER — HYDROCHLOROTHIAZIDE 25 MG PO TABS: 25.0000 mg | ORAL_TABLET | Freq: Every day | ORAL | 0 refills | Status: DC

## 2019-02-01 MED ORDER — AMLODIPINE BESYLATE 10 MG PO TABS: 10.0000 mg | ORAL_TABLET | Freq: Every day | ORAL | 0 refills | Status: DC

## 2019-02-01 NOTE — Progress Notes (Signed)
Nathaniel Daugherty is a 43 y.o. male is here to discuss:  I acted as a Neurosurgeon for [employer], PA-C Corky Mull, LPN  History of Present Illness:   Chief Complaint  Patient presents with   Hypertension    Currently on 10 mg Norvasc and 25 mg HCTZ.   Hypertension  This is a chronic problem. The current episode started 1 to 4 weeks ago (Pt here for follow up on blood pressure has been checking daily at home is averaging 140/101.). The problem is unchanged. The problem is uncontrolled. Pertinent negatives include no blurred vision, chest pain, headaches, palpitations, peripheral edema or shortness of breath. Risk factors for coronary artery disease include obesity and male gender. The current treatment provides no improvement.    There are no preventive care reminders to display for this patient.  Past Medical History:  Diagnosis Date   Hypertension      Social History   Socioeconomic History   Marital status: Single    Spouse name: Not on file   Number of children: 4   Years of education: Not on file   Highest education level: Not on file  Occupational History   Not on file  Social Needs   Financial resource strain: Not on file   Food insecurity:    Worry: Not on file    Inability: Not on file   Transportation needs:    Medical: Not on file    Non-medical: Not on file  Tobacco Use   Smoking status: Never Smoker   Smokeless tobacco: Never Used  Substance and Sexual Activity   Alcohol use: No   Drug use: No   Sexual activity: Yes    Birth control/protection: Condom  Lifestyle   Physical activity:    Days per week: Not on file    Minutes per session: Not on file   Stress: Not on file  Relationships   Social connections:    Talks on phone: Not on file    Gets together: Not on file    Attends religious service: Not on file    Active member of club or organization: Not on file    Attends meetings of clubs or organizations: Not on file     Relationship status: Not on file   Intimate partner violence:    Fear of current or ex partner: Not on file    Emotionally abused: Not on file    Physically abused: Not on file    Forced sexual activity: Not on file  Other Topics Concern   Not on file  Social History Narrative   Not on file    History reviewed. No pertinent surgical history.  Family History  Problem Relation Age of Onset   Prostate cancer Neg Hx    Colon cancer Neg Hx     PMHx, SurgHx, SocialHx, FamHx, Medications, and Allergies were reviewed in the Visit Navigator and updated as appropriate.   Patient Active Problem List   Diagnosis Date Noted   Obesity 01/18/2019   Essential hypertension 01/18/2019    Social History   Tobacco Use   Smoking status: Never Smoker   Smokeless tobacco: Never Used  Substance Use Topics   Alcohol use: No   Drug use: No    Current Medications and Allergies:    Current Outpatient Medications:    amLODipine (NORVASC) 10 MG tablet, Take 1 tablet (10 mg total) by mouth daily., Disp: 90 tablet, Rfl: 0   hydrochlorothiazide (HYDRODIURIL) 25 MG tablet, Take  1 tablet (25 mg total) by mouth daily., Disp: 90 tablet, Rfl: 0  No Known Allergies  Review of Systems   Review of Systems  Eyes: Negative for blurred vision.  Respiratory: Negative for shortness of breath.   Cardiovascular: Negative for chest pain and palpitations.  Neurological: Negative for headaches.    Vitals:   Vitals:   02/01/19 0940 02/01/19 0957  BP: 128/90 124/90  Pulse: 90   Temp: 98.2 F (36.8 C)   TempSrc: Oral   SpO2: 96%   Weight: 230 lb (104.3 kg)   Height: 5' 8.25" (1.734 m)      Body mass index is 34.72 kg/m.   Physical Exam:    Physical Exam Vitals signs and nursing note reviewed.  Constitutional:      General: He is not in acute distress.    Appearance: He is well-developed. He is not ill-appearing or toxic-appearing.  Cardiovascular:     Rate and Rhythm:  Normal rate and regular rhythm.     Pulses: Normal pulses.     Heart sounds: Normal heart sounds, S1 normal and S2 normal.     Comments: No LE edema Pulmonary:     Effort: Pulmonary effort is normal.     Breath sounds: Normal breath sounds.  Skin:    General: Skin is warm and dry.  Neurological:     Mental Status: He is alert.     GCS: GCS eye subscore is 4. GCS verbal subscore is 5. GCS motor subscore is 6.  Psychiatric:        Speech: Speech normal.        Behavior: Behavior normal. Behavior is cooperative.      Assessment and Plan:    Michall was seen today for hypertension.  Diagnoses and all orders for this visit:  Essential hypertension Has appointment scheduled for next month for sleep study. Will have him continue current regimen for now, as systolic is well controlled in our office today. Follow-up in 6 weeks, sooner if issues.  Need for prophylactic vaccination with combined diphtheria-tetanus-pertussis (DTP) vaccine -     Tdap vaccine greater than or equal to 7yo IM  Other orders -     hydrochlorothiazide (HYDRODIURIL) 25 MG tablet; Take 1 tablet (25 mg total) by mouth daily. -     amLODipine (NORVASC) 10 MG tablet; Take 1 tablet (10 mg total) by mouth daily.     Reviewed expectations re: course of current medical issues.  Discussed self-management of symptoms.  Outlined signs and symptoms indicating need for more acute intervention.  Patient verbalized understanding and all questions were answered.  See orders for this visit as documented in the electronic medical record.  Patient received an After Visit Summary.  CMA or LPN served as scribe during this visit. History, Physical, and Plan performed by medical provider. The above documentation has been reviewed and is accurate and complete.   Jarold Motto, PA-C [place] [place] 02/01/2019  Follow-up: No follow-ups on file.

## 2019-02-01 NOTE — Telephone Encounter (Signed)
Patient contacted and is coming in for visit.

## 2019-02-01 NOTE — Patient Instructions (Signed)
It was great to see you!  Continue blood pressure medications.  You may need a large blood pressure cuff.  Continue with sleep study.  Take care,  Jarold Motto PA-C

## 2019-02-09 ENCOUNTER — Other Ambulatory Visit: Payer: Self-pay | Admitting: Physician Assistant

## 2019-03-09 ENCOUNTER — Telehealth: Payer: Self-pay | Admitting: Neurology

## 2019-03-09 NOTE — Telephone Encounter (Signed)
Due to current COVID 19 pandemic, our office is severely reducing in office visits, in order to minimize the risk to our patients and healthcare providers.  Pt understands that although there may be some limitations with this type of visit, we will take all precautions to reduce any security or privacy concerns.  Pt understands that this will be treated like an in office visit and we will file with pt's insurance, and there may be a patient responsible charge related to this service. Pt's email is [email or web address]. Pt understands that the nurse will be calling to go over pt's chart.

## 2019-03-10 ENCOUNTER — Encounter: Payer: Self-pay | Admitting: Neurology

## 2019-03-10 NOTE — Telephone Encounter (Signed)
I called pt. Pt's meds, allergies, and PMH were updated.  Pt has never had a sleep study but does endorse snoring.  Pt was instructed on how to measure his neck size prior to his appt.  Pt's weight is 221 lbs and he is 5'5.  Epworth Sleepiness Scale 0= would never doze 1= slight chance of dozing 2= moderate chance of dozing 3= high chance of dozing  Sitting and reading: 3 Watching TV: 3 Sitting inactive in a public place (ex. Theater or meeting): 3 As a passenger in a car for an hour without a break: 3 Lying down to rest in the afternoon: 3 Sitting and talking to someone: 3 Sitting quietly after lunch (no alcohol): 3 In a car, while stopped in traffic: 3 Total: 24  FSS: 50

## 2019-03-11 ENCOUNTER — Other Ambulatory Visit: Payer: Self-pay

## 2019-03-11 ENCOUNTER — Encounter: Payer: Self-pay | Admitting: Neurology

## 2019-03-11 ENCOUNTER — Ambulatory Visit (INDEPENDENT_AMBULATORY_CARE_PROVIDER_SITE_OTHER): Payer: No Typology Code available for payment source | Admitting: Neurology

## 2019-03-11 DIAGNOSIS — E669 Obesity, unspecified: Secondary | ICD-10-CM | POA: Diagnosis not present

## 2019-03-11 DIAGNOSIS — R400 Somnolence: Secondary | ICD-10-CM | POA: Diagnosis not present

## 2019-03-11 DIAGNOSIS — Z9189 Other specified personal risk factors, not elsewhere classified: Secondary | ICD-10-CM

## 2019-03-11 DIAGNOSIS — R0681 Apnea, not elsewhere classified: Secondary | ICD-10-CM | POA: Diagnosis not present

## 2019-03-11 DIAGNOSIS — Z820 Family history of epilepsy and other diseases of the nervous system: Secondary | ICD-10-CM

## 2019-03-11 NOTE — Patient Instructions (Signed)
Given verbally, during today's virtual video-based encounter, with verbal feedback received.   

## 2019-03-11 NOTE — Progress Notes (Signed)
Huston Foley, MD, PhD [facility] [address] [address] [place], [place] [address]   Virtual Visit via Video Note on 03/11/2019:  I connected with Nathaniel Daugherty on 03/11/19 at  3:30 PM EDT by a video enabled telemedicine application and verified that I am speaking with the correct person using two identifiers.   I discussed the limitations of evaluation and management by telemedicine and the availability of in person appointments. The patient expressed understanding and agreed to proceed.  History of Present Illness: Nathaniel Daugherty is a 43 year old right-handed man with an underlying medical history of hypertension and obesity, with whom I am conducting a virtual, video based new patient visit via [email or web address] in lieu of a face-to-face visit for evaluation of his sleep disorder, in particular, evaluation for underlying obstructive sleep apnea. The patient is unaccompanied today and joins via cell phone from home, I am in my office. He is referred by his primary care PA, Nathaniel Daugherty, and I reviewed her note from 02/01/2019.   His Epworth sleepiness score is 24 out of 24, fatigue score is 15 out of 63. He admits to being sleepy for the past 3 or 4 years. He admits to dozing off while driving and even while at work. He reports no are accidents or problems at work. 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 Bedtime is generally around 8 and rise time between 3:30 and 4. He denies night to night nocturia or recurrent morning headaches recently. He believes his father has sleep apnea but is not sure if he has a CPAP machine. He has woken up with a sense of gasping for air and has been witnessed pauses in his breathing by his fiance. He lives with his fiance and fiancs young daughter. They have no pets in the household. He is a nonsmoker and drinks caffeine occasionally in the form of tea and has cut down on sodas. He drinks  alcohol occasionally. He has a TV in the bedroom which tends to stay on all night. His weight has been fluctuating. He is trying to work on weight loss.   His Past Medical History Is Significant For: Past Medical History:  Diagnosis Date   Hypertension     His Past Surgical History Is Significant For: No past surgical history on file.  His Family History Is Significant For: Family History  Problem Relation Age of Onset   Hypertension Mother    Hypertension Father    Prostate cancer Neg Hx    Colon cancer Neg Hx     His Social History Is Significant For: Social History   Socioeconomic History   Marital status: Single    Spouse name: Not on file   Number of children: 4   Years of education: Not on file   Highest education level: Not on file  Occupational History   Not on file  Social Needs   Financial resource strain: Not on file   Food insecurity:    Worry: Not on file    Inability: Not on file   Transportation needs:    Medical: Not on file    Non-medical: Not on file  Tobacco Use   Smoking status: Never Smoker   Smokeless tobacco: Never Used  Substance and Sexual Activity   Alcohol use: No   Drug use: No   Sexual activity: Yes    Birth control/protection: Condom  Lifestyle   Physical activity:  Days per week: Not on file    Minutes per session: Not on file   Stress: Not on file  Relationships   Social connections:    Talks on phone: Not on file    Gets together: Not on file    Attends religious service: Not on file    Active member of club or organization: Not on file    Attends meetings of clubs or organizations: Not on file    Relationship status: Not on file  Other Topics Concern   Not on file  Social History Narrative   Not on file    His Allergies Are:  No Known Allergies:   His Current Medications Are:  Outpatient Encounter Medications as of 03/11/2019  Medication Sig   amLODipine (NORVASC) 10 MG tablet TAKE 1  TABLET BY MOUTH EVERY DAY   hydrochlorothiazide (HYDRODIURIL) 25 MG tablet Take 1 tablet (25 mg total) by mouth daily.   No facility-administered encounter medications on file as of 03/11/2019.   :   Review of Systems:  Out of a complete 14 point review of systems, all are reviewed and negative with the exception of these symptoms as listed below:  Observations/Objective: His most recent vital signs available for my review of in the chart are from 02/01/2019: Blood pressure 124/90, pulse 90, temperature 98.2, weight 230 pounds for BMI of 34.72.   His most recent weight at home by self report is 225 pounds, neck circumference by self-report was 16 inches.on examination he is pleasant, conversant, in no acute distress. He wears corrective eyeglasses. Extraocular movements are preserved with no gaze limitation, no nystagmus noted. Face is symmetric with normal facial animation noted. Hearing is grossly intact.airway examination reveals moderate airway crowding secondary to thicker tongue, large uvula, smaller airway entry, tonsils in place. Mallampati is class II. He has no obvious overbite, perhaps a tiny underbite even, tongue protrudes centrally and palate elevates symmetrically. Shoulder height is equal. He stands without difficulty, Romberg is negative. His walking is unremarkable. Motor exam reveals normal bulk, he has no drift, no postural or action tremor. On finger to nose testing for cerebellar testing he has no dysmetria or intention tremor.   Assessment and Plan: Nathaniel Daugherty is a 43 year old right-handed man with an underlying medical history of hypertension and obesity, with whom I am conducting a virtual, video based new patient visit via Webex in lieu of a face-to-face visit for evaluation of an underlying organic sleep disorder, in particular, concern for obstructive sleep apnea. The patient's medical history and physical exam (albeit limited with current video-based  evaluation) are concerning for a diagnosis of obstructive sleep apnea. I discussed with the patient the diagnosis of OSA, its prognosis and treatment options. I explained in particular the risks and ramifications of untreated moderate to severe OSA, especially with respect to developing cardiovascular disease down the Road, including congestive heart failure, difficult to treat hypertension, cardiac arrhythmias, or stroke. Even type 2 diabetes has, in part, been linked to untreated OSA. Symptoms of untreated OSA may include daytime sleepiness, memory problems, mood irritability and mood disorder such as depression and anxiety, lack of energy, as well as recurrent headaches, especially morning headaches. We talked about the importance of weight control. We talked about the importance of maintaining good sleep hygiene. 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 I recommended the following at this time: home sleep test, vs. Laboratory tended sleep study. He would prefer to come in for a sleep study but is reminded that currently we are not running any sleep studies in our lab and there is a delay until we can open the lab back up safely for our patients. We will check with his insurance as well. I explained the sleep test procedure to the patient and also outlined possible treatment options of OSA, including the use of a custom-made dental device (which would require a referral to a specialist dentist), upper airway surgical options, (such as UPPP, which would involve a referral to an ENT). I also explained the CPAP vs. AutoPAP treatment option to the patient, who indicated that he would be willing to try CPAP if the need arises. I answered all his questions today and the patient was in agreement. I plan to see the patient back  after the sleep study is completed and encouraged him to call with any interim questions, concerns, problems or updates.   Huston Foley, MD, PhD    Follow Up Instructions: 1. Lab attended sleep study or if needed, HST, sleep lab staff will reach out to patient to arrange for either sending the unit to home address or a "drive by pickup" and for tutorial, making sure patient is comfortable with the unit and usage, and return of equipment, if necessary.  2. Consider AutoPap therapy, if home sleep test positive for obstructive sleep apnea, patient agreeable. 3. We talked about alternative treatment options and current limitations, due to virus pandemic.  4. Follow-up after starting AutoPap therapy, follow-up to be scheduled according to set-up date, typically within 31 to 89 days post treatment start. 5. Pursue healthy lifestyle, good sleep hygiene, healthy weight. 6. Call for any interim questions or concerns.    I discussed the assessment and treatment plan with the patient. The patient was provided an opportunity to ask questions and all were answered. The patient agreed with the plan and demonstrated an understanding of the instructions.   The patient was advised to call back or seek an in-person evaluation if the symptoms worsen or if the condition fails to improve as anticipated.   I provided 30 minutes of non-face-to-face time during this encounter.   Huston Foley, MD

## 2019-03-15 ENCOUNTER — Encounter: Payer: Self-pay | Admitting: Physician Assistant

## 2019-03-15 ENCOUNTER — Ambulatory Visit (INDEPENDENT_AMBULATORY_CARE_PROVIDER_SITE_OTHER): Payer: No Typology Code available for payment source | Admitting: Physician Assistant

## 2019-03-15 ENCOUNTER — Ambulatory Visit: Payer: No Typology Code available for payment source | Admitting: Physician Assistant

## 2019-03-15 VITALS — BP 140/97

## 2019-03-15 DIAGNOSIS — I10 Essential (primary) hypertension: Secondary | ICD-10-CM | POA: Diagnosis not present

## 2019-03-15 NOTE — Progress Notes (Signed)
   Virtual Visit via Video   I connected with Nathaniel Daugherty on 03/15/19 at 10:20 AM EDT by a video enabled telemedicine application and verified that I am speaking with the correct person using two identifiers. Location patient: Home Location provider: [facility], Office Persons participating in the virtual visit: Abdulkarim Romar, [place], [name]   I discussed the limitations of evaluation and management by telemedicine and the availability of in person appointments. The patient expressed understanding and agreed to proceed.  Subjective:   HPI:  Hypertension Pt for follow up on blood pressure today. Pt checks blood pressure at home daily. BP averaging 118-140 systolic and 89-97 diastolic. Pt denies headaches, dizziness, blurred vision, chest pain , SOB or LLE. He does note that the blood pressure medication makes him urinate more. He recently had a virtual visit with the neurologist to discuss sleep apnea and he will plan to have a sleep study soon pending his insurance.  ROS: See pertinent positives and negatives per HPI.  Patient Active Problem List   Diagnosis Date Noted  . Obesity 01/18/2019  . Essential hypertension 01/18/2019    Social History   Tobacco Use  . Smoking status: Never Smoker  . Smokeless tobacco: Never Used  Substance Use Topics  . Alcohol use: No    Current Outpatient Medications:  .  amLODipine (NORVASC) 10 MG tablet, TAKE 1 TABLET BY MOUTH EVERY DAY, Disp: 30 tablet, Rfl: 0 .  hydrochlorothiazide (HYDRODIURIL) 25 MG tablet, Take 1 tablet (25 mg total) by mouth daily., Disp: 90 tablet, Rfl: 0  No Known Allergies  Objective:   VITALS: Per patient if applicable, see vitals. GENERAL: Alert, appears well and in no acute distress. HEENT: Atraumatic, conjunctiva clear, no obvious abnormalities on inspection of external nose and ears. NECK: Normal movements of the head and neck. CARDIOPULMONARY: No increased WOB. Speaking in clear sentences. I:E  ratio WNL.  MS: Moves all visible extremities without noticeable abnormality. PSYCH: Pleasant and cooperative, well-groomed. Speech normal rate and rhythm. Affect is appropriate. Insight and judgement are appropriate. Attention is focused, linear, and appropriate.  NEURO: CN grossly intact. Oriented as arrived to appointment on time with no prompting. Moves both UE equally.  SKIN: No obvious lesions, wounds, erythema, or cyanosis noted on face or hands.  Assessment and Plan:   Merlin was seen today for hypertension.  Diagnoses and all orders for this visit:  Essential hypertension   Currently well controlled based on home readings. Will continue current regimen. Encouraged patient to get his sleep study to help with BP as well. Follow-up in 6 months, sooner if concerns.  . Reviewed expectations re: course of current medical issues. . Discussed self-management of symptoms. . Outlined signs and symptoms indicating need for more acute intervention. . Patient verbalized understanding and all questions were answered. Marland Kitchen Health Maintenance issues including appropriate healthy diet, exercise, and smoking avoidance were discussed with patient. . See orders for this visit as documented in the electronic medical record.  I discussed the assessment and treatment plan with the patient. The patient was provided an opportunity to ask questions and all were answered. The patient agreed with the plan and demonstrated an understanding of the instructions.   The patient was advised to call back or seek an in-person evaluation if the symptoms worsen or if the condition fails to improve as anticipated.    [place], [name] 03/15/2019

## 2019-03-31 ENCOUNTER — Other Ambulatory Visit: Payer: Self-pay

## 2019-03-31 ENCOUNTER — Encounter (HOSPITAL_COMMUNITY): Payer: Self-pay

## 2019-03-31 ENCOUNTER — Ambulatory Visit (HOSPITAL_COMMUNITY)
Admission: EM | Admit: 2019-03-31 | Discharge: 2019-03-31 | Disposition: A | Discharge status: Home / Self Care | Payer: No Typology Code available for payment source | Attending: Family Medicine | Admitting: Family Medicine

## 2019-03-31 DIAGNOSIS — I10 Essential (primary) hypertension: Secondary | ICD-10-CM

## 2019-03-31 DIAGNOSIS — Z202 Contact with and (suspected) exposure to infections with a predominantly sexual mode of transmission: Secondary | ICD-10-CM

## 2019-03-31 DIAGNOSIS — Z113 Encounter for screening for infections with a predominantly sexual mode of transmission: Secondary | ICD-10-CM | POA: Insufficient documentation

## 2019-03-31 LAB — POCT URINALYSIS DIP (DEVICE)
Bilirubin Urine: NEGATIVE
Glucose, UA: NEGATIVE mg/dL
Hgb urine dipstick: NEGATIVE
Ketones, ur: NEGATIVE mg/dL
Nitrite: NEGATIVE
Protein, ur: NEGATIVE mg/dL
Specific Gravity, Urine: >=1.03 (ref 1.005–1.030)
Urobilinogen, UA: 1 mg/dL (ref 0.0–1.0)
pH: 5.5 (ref 5.0–8.0)

## 2019-03-31 NOTE — ED Triage Notes (Signed)
Pt is here today wanting to get some STD screening.

## 2019-03-31 NOTE — Discharge Instructions (Addendum)
We are sending your urine for STD testing We will call with any positive results and treat as needed.

## 2019-04-01 LAB — URINE CYTOLOGY ANCILLARY ONLY
Chlamydia: NEGATIVE
Neisseria Gonorrhea: NEGATIVE
Trichomonas: NEGATIVE

## 2019-04-01 NOTE — ED Provider Notes (Signed)
[facility]    CSN: [id number] Arrival date & time: 03/31/19  1515     History   Chief Complaint Chief Complaint  Patient presents with  . SEXUALLY TRANSMITTED DISEASE    HPI Nathaniel Daugherty is a 43 y.o. male.   Patient is a 43 year old male with past medical history of hypertension.  He presents today wanting STD screening.  He denies any current symptoms.  He is sexually active with one partner, unprotected.       Past Medical History:  Diagnosis Date  . Hypertension     Patient Active Problem List   Diagnosis Date Noted  . Obesity 01/18/2019  . Essential hypertension 01/18/2019    History reviewed. No pertinent surgical history.     Home Medications    Prior to Admission medications   Medication Sig Start Date End Date Taking? Authorizing Provider  amLODipine (NORVASC) 10 MG tablet TAKE 1 TABLET BY MOUTH EVERY DAY 02/09/19   Jarold Motto, PA  hydrochlorothiazide (HYDRODIURIL) 25 MG tablet Take 1 tablet (25 mg total) by mouth daily. 02/01/19   Jarold Motto, PA    Family History Family History  Problem Relation Age of Onset  . Hypertension Mother   . Hypertension Father   . Prostate cancer Neg Hx   . Colon cancer Neg Hx     Social History Social History   Tobacco Use  . Smoking status: Never Smoker  . Smokeless tobacco: Never Used  Substance Use Topics  . Alcohol use: No  . Drug use: No     Allergies   Patient has no known allergies.   Review of Systems Review of Systems  Genitourinary: Negative for decreased urine volume, difficulty urinating, discharge, dysuria, enuresis, flank pain, frequency, genital sores, hematuria, penile pain, penile swelling, scrotal swelling, testicular pain and urgency.     Physical Exam Triage Vital Signs ED Triage Vitals  Enc Vitals Group     BP 03/31/19 1541 (!) 149/108     Pulse Rate 03/31/19 1541 100     Resp 03/31/19 1541 18     Temp 03/31/19 1541 98.9 F (37.2 C)     Temp  Source 03/31/19 1541 Oral     SpO2 03/31/19 1541 98 %     Weight 03/31/19 1539 221 lb (100.2 kg)     Height --      Head Circumference --      Peak Flow --      Pain Score 03/31/19 1539 0     Pain Loc --      Pain Edu? --      Excl. in GC? --    No data found.  Updated Vital Signs BP (!) 149/108 (BP Location: Right Arm)   Pulse 100   Temp 98.9 F (37.2 C) (Oral)   Resp 18   Wt 221 lb (100.2 kg)   SpO2 98%   BMI 33.36 kg/m   Visual Acuity Right Eye Distance:   Left Eye Distance:   Bilateral Distance:    Right Eye Near:   Left Eye Near:    Bilateral Near:     Physical Exam Vitals signs and nursing note reviewed.  Constitutional:      Appearance: Normal appearance.  HENT:     Head: Normocephalic and atraumatic.     Nose: Nose normal.  Eyes:     Conjunctiva/sclera: Conjunctivae normal.  Neck:     Musculoskeletal: Normal range of motion.  Pulmonary:     Effort: Pulmonary  effort is normal.  Musculoskeletal: Normal range of motion.  Skin:    General: Skin is warm and dry.  Neurological:     Mental Status: He is alert.  Psychiatric:        Mood and Affect: Mood normal.      UC Treatments / Results  Labs (all labs ordered are listed, but only abnormal results are displayed) Labs Reviewed  POCT URINALYSIS DIP (DEVICE) - Abnormal; Notable for the following components:      Result Value   Leukocytes,Ua TRACE (*)    All other components within normal limits  URINE CYTOLOGY ANCILLARY ONLY    EKG None  Radiology No results found.  Procedures Procedures (including critical care time)  Medications Ordered in UC Medications - No data to display  Initial Impression / Assessment and Plan / UC Course  I have reviewed the triage vital signs and the nursing notes.  Pertinent labs & imaging results that were available during my care of the patient were reviewed by me and considered in my medical decision making (see chart for details).     Urine sent for  cytology. Labs pending Final Clinical Impressions(s) / UC Diagnoses   Final diagnoses:  Screening for STD (sexually transmitted disease)     Discharge Instructions     We are sending your urine for STD testing We will call with any positive results and treat as needed.    ED Prescriptions    None     Controlled Substance Prescriptions  Controlled Substance Registry consulted? Not Applicable   Janace ArisBast, Joslynn Jamroz A, NP 04/01/19 [phone]

## 2019-04-07 ENCOUNTER — Other Ambulatory Visit: Payer: Self-pay | Admitting: Physician Assistant

## 2019-04-08 MED ORDER — AMLODIPINE BESYLATE 10 MG PO TABS: 10.0000 mg | ORAL_TABLET | Freq: Every day | ORAL | 1 refills | Status: DC

## 2019-04-08 MED ORDER — HYDROCHLOROTHIAZIDE 25 MG PO TABS: 25.0000 mg | ORAL_TABLET | Freq: Every day | ORAL | 1 refills | Status: DC

## 2019-04-25 ENCOUNTER — Ambulatory Visit (INDEPENDENT_AMBULATORY_CARE_PROVIDER_SITE_OTHER): Payer: Self-pay | Admitting: Neurology

## 2019-04-25 DIAGNOSIS — R400 Somnolence: Secondary | ICD-10-CM

## 2019-04-25 DIAGNOSIS — Z9189 Other specified personal risk factors, not elsewhere classified: Secondary | ICD-10-CM

## 2019-04-25 DIAGNOSIS — R0681 Apnea, not elsewhere classified: Secondary | ICD-10-CM

## 2019-04-25 DIAGNOSIS — Z820 Family history of epilepsy and other diseases of the nervous system: Secondary | ICD-10-CM

## 2019-04-25 DIAGNOSIS — G4733 Obstructive sleep apnea (adult) (pediatric): Secondary | ICD-10-CM

## 2019-04-25 DIAGNOSIS — E669 Obesity, unspecified: Secondary | ICD-10-CM

## 2019-04-29 ENCOUNTER — Telehealth: Payer: Self-pay

## 2019-04-29 NOTE — Telephone Encounter (Signed)
I called pt. I advised pt that Dr. Rexene Alberts reviewed their sleep study results and found that pt moderate to severe osa. Dr. Rexene Alberts recommends that pt start an auto pap at home. I reviewed PAP compliance expectations with the pt. Pt is agreeable to starting an auto-PAP. I advised pt that an order will be sent to a DME, [facility], and [facility] will call the pt within about one week after they file with the pt's insurance. [facility] will show the pt how to use the machine, fit for masks, and troubleshoot the auto-PAP if needed. A follow up appt was made for insurance purposes with Dr. Rexene Alberts on 07/01/2019 at 3:00pm. Pt verbalized understanding to arrive 15 minutes early and bring their auto-PAP. A letter with all of this information in it will be mailed to the pt as a reminder. I verified with the pt that the address we have on file is correct. Pt verbalized understanding of results. Pt had no questions at this time but was encouraged to call back if questions arise. I have sent the order to [facility] and have received confirmation that they have received the order.

## 2019-04-29 NOTE — Addendum Note (Signed)
Addended by: Star Age on: 04/29/2019 07:59 AM   Modules accepted: Orders

## 2019-04-29 NOTE — Telephone Encounter (Signed)
-----   Message from Star Age, MD sent at 04/29/2019  7:59 AM EDT ----- Patient referred by Nathaniel Coke, PA, seen virtually by me on 03/11/19, PSG on 04/25/19.    Please call and notify the patient that the recent sleep study showed obstructive sleep apnea in the moderate to severe range. While I recommend treatment for this in the form CPAP, we are not yet bringing patients in for in-lab testing for CPAP titration studies, due to the virus pandemic; therefore, I suggest we start him on a trial of autoPAP at home, which means, that we don't have to bring him in for a sleep study with CPAP, but will let him start using an autoPAP machine at home, through a DME company (of patient's choice, or as per insurance requirement, as per in [employer], if there are such restrictions, depending on insurance carrier). The DME representative will educate the patient on how to use the machine, how to put the mask on, etc. I have placed an order in the chart. Please send referral, talk to patient, send report to referring MD. We will need a FU in sleep clinic for 10 weeks post-PAP set up, please arrange that with me or one of our NPs.  Also, please remind patient about the importance of compliance with PAP usage; this is an Designer, industrial/product, but good compliance also helps [place] track improvements in patient's sleep related complaints and objective improvements, such as BP and weight for example or nocturia or headaches, etc. For concerns and questions about how to clean the PAP machine and the supplies and how frequently to change the hose, mask and filters, etc., patient can call the DME company, for more information, education and troubleshooting. Especially in the current situation, I recommend, patients be extra mindful about hand hygiene, handling the PAP equipment only with clean hands, wipe the mask daily, keep little one and four-legged companions (and any other pets for that matter) away from the machine and  mask at all times.     Thanks,   Star Age, MD, PhD [facility])

## 2019-04-29 NOTE — Procedures (Signed)
PATIENT'S NAME:  Nathaniel Daugherty, Nathaniel Daugherty DOB:      1976/08/09      MR#:    [id number]     DATE OF RECORDING: 04/25/2019 REFERRING M.D.:  Inda Coke, [place] Study Performed:   Baseline Polysomnogram HISTORY: 43 year old man with a history of hypertension and obesity, who reports snoring and excessive sleepiness. His Epworth sleepiness score is 24 out of 24. The patient's weight 230 pounds with a height of 67 (inches), resulting in a BMI of 36. kg/m2. The patient's neck circumference measured 16 inches.  CURRENT MEDICATIONS: Norvasc, Hydrodiuril.   PROCEDURE:  This is a multichannel digital polysomnogram utilizing the Somnostar 11.2 system.  Electrodes and sensors were applied and monitored per AASM Specifications.   EEG, EOG, Chin and Limb EMG, were sampled at 200 Hz.  ECG, Snore and Nasal Pressure, Thermal Airflow, Respiratory Effort, CPAP Flow and Pressure, Oximetry was sampled at 50 Hz. Digital video and audio were recorded.      BASELINE STUDY  Lights Out was at 21:53 and Lights On at 05:00.  Total recording time (TRT) was 427.5 minutes, with a total sleep time (TST) of 397.5 minutes.   The patient's sleep latency was 2.5 minutes, which is reduced. REM latency was 50.5 minutes, which is mildly reduced. The sleep efficiency was 93. %.     SLEEP ARCHITECTURE: WASO (Wake after sleep onset) was 27 minutes, with one longer period of wakefulness, but otherwise no significant sleep fragmentation noted. There were 4 minutes in Stage N1, 255 minutes Stage N2, 30 minutes Stage N3 and 108.5 minutes in Stage REM.  The percentage of Stage N1 was 1.%, Stage N2 was 64.2%, which is mildly increased, Stage N3 was 7.5%, and Stage R (REM sleep) was 27.3%, which is mildly increased. The arousals were noted as: 27 were spontaneous, 0 were associated with PLMs, 39 were associated with respiratory events.  RESPIRATORY ANALYSIS:  There were a total of 153 respiratory events:  34 obstructive apneas, 25 central apneas and 0  mixed apneas with a total of 59 apneas and an apnea index (AI) of 8.9 /hour. There were 94 hypopneas with a hypopnea index of 14.2 /hour. The patient also had 0 respiratory event related arousals (RERAs).      The total APNEA/HYPOPNEA INDEX (AHI) was 23.1/hour and the total RESPIRATORY DISTURBANCE INDEX was 23.1 /hour.  91 events occurred in REM sleep and 80 events in NREM. The REM AHI was 50.3 /hour, versus a non-REM AHI of 12.9. The patient spent 30.5 minutes of total sleep time in the supine position and 367 minutes in non-supine.. The supine AHI was 72.7 versus a non-supine AHI of 19.0.  OXYGEN SATURATION & C02:  The Wake baseline 02 saturation was 94%, with the lowest being 83%. Time spent below 89% saturation equaled 29 minutes.  PERIODIC LIMB MOVEMENTS: The patient had a total of 0 Periodic Limb Movements.  The Periodic Limb Movement (PLM) index was 0 and the PLM Arousal index was 0/hour. Audio and video analysis did not show any abnormal or unusual movements, behaviors, phonations or vocalizations. The patient took 1 bathroom break. Mild to moderate snoring was noted. The EKG was in keeping with normal sinus rhythm (NSR).  Post-study, the patient indicated that sleep was the same as usual.   IMPRESSION:  1. Obstructive Sleep Apnea (OSA)  RECOMMENDATIONS:  1. This sleep study demonstrates moderate to severe obstructive sleep apnea with a total AHI of 23.1/hour, REM AHI of 50.3/hour, supine AHI of 72.7/hour, and O2  nadir of 83%. Treatment with positive airway pressure (PAP) - in the form of CPAP - is recommended. This will require, ideally, a full night CPAP titration study for proper treatment settings, O2 monitoring and mask fitting. Based on the severity of the sleep disordered breathing, an attended titration study is indicated. However, under the current circumstances (i.e. the COVID-19 pandemic), in order to ensure continuity of care and for the safety of the patient and healthcare  professionals, the patient will be advised to proceed with an autoPAP titration/trial at home. A proper overnight, lab-attended PAP titration study with CPAP may be helpful or needed down the road to optimize treatment, when considered safe. Please note, that untreated obstructive sleep apnea may carry additional perioperative morbidity. Patients with significant obstructive sleep apnea should receive perioperative PAP therapy and the surgeons and particularly the anesthesiologist should be informed of the diagnosis and the severity of the sleep disordered breathing. Patient will be reminded regarding compliance with the PAP machine and to be mindful of cleanliness with the equipment and timely with supply changes (i.e. changing filter, mask, hose, humidifier chamber on an ongoing basis, as recommended, and cleaning parts that touch the face and nose daily, etc).  2. The patient should be cautioned not to drive, work at heights, or operate dangerous or heavy equipment when tired or sleepy. Review and reiteration of good sleep hygiene measures should be pursued with any patient. 3. The patient will be [facility] for discussion of the test results, symptom and treatment compliance review, further management strategies, etc. The referring provider will be notified of the test results.  I certify that I have reviewed the entire raw data recording prior to the issuance of this report in accordance with the Standards of Accreditation of the American Academy of Sleep Medicine (AASM)  Huston FoleySaima Jashua Knaak, MD, PhD Diplomat, American Board of Neurology and Sleep Medicine (Neurology and Sleep Medicine)

## 2019-04-29 NOTE — Telephone Encounter (Signed)
I called pt again to discuss his sleep study results. No answer, left a message asking him to call me back. 

## 2019-04-29 NOTE — Telephone Encounter (Signed)
Pt has returned the call to RN Kristen, he is asking for a call back °

## 2019-04-29 NOTE — Progress Notes (Signed)
Patient referred by Inda Coke, PA, seen virtually by me on 03/11/19, PSG on 04/25/19.    Please call and notify the patient that the recent sleep study showed obstructive sleep apnea in the moderate to severe range. While I recommend treatment for this in the form CPAP, we are not yet bringing patients in for in-lab testing for CPAP titration studies, due to the virus pandemic; therefore, I suggest we start him on a trial of autoPAP at home, which means, that we don't have to bring him in for a sleep study with CPAP, but will let him start using an autoPAP machine at home, through a DME company (of patient's choice, or as per insurance requirement, as per in [employer], if there are such restrictions, depending on insurance carrier). The DME representative will educate the patient on how to use the machine, how to put the mask on, etc. I have placed an order in the chart. Please send referral, talk to patient, send report to referring MD. We will need a FU in sleep clinic for 10 weeks post-PAP set up, please arrange that with me or one of our NPs.  Also, please remind patient about the importance of compliance with PAP usage; this is an Designer, industrial/product, but good compliance also helps [place] track improvements in patient's sleep related complaints and objective improvements, such as BP and weight for example or nocturia or headaches, etc. For concerns and questions about how to clean the PAP machine and the supplies and how frequently to change the hose, mask and filters, etc., patient can call the DME company, for more information, education and troubleshooting. Especially in the current situation, I recommend, patients be extra mindful about hand hygiene, handling the PAP equipment only with clean hands, wipe the mask daily, keep little one and four-legged companions (and any other pets for that matter) away from the machine and mask at all times.    Thanks,   Star Age, MD, PhD [facility])

## 2019-04-29 NOTE — Telephone Encounter (Signed)
I called pt to discuss his sleep study results. No answer, left a message asking him to call me back. 

## 2019-04-29 NOTE — Telephone Encounter (Signed)
Pt returned call. Please call as soon as available.  °

## 2019-06-29 ENCOUNTER — Telehealth: Payer: Self-pay | Admitting: Physician Assistant

## 2019-06-29 NOTE — Telephone Encounter (Signed)
°  LAST APPOINTMENT DATE:03/15/2019  NEXT APPOINTMENT DATE:@Visit  date not found  MEDICATION: any medications that need refills (did not give names of medication)  PHARMACY: [id number] [address], [place] (Phone) [phone] (Fax)    **Let patient know to contact pharmacy at the end of the day to make sure medication is ready. **  ** Please notify patient to allow 48-72 hours to process**  **Encourage patient to contact the pharmacy for refills or they can request refills through [facility]**  CLINICAL FILLS OUT ALL BELOW:   LAST REFILL:  QTY:  REFILL DATE:    OTHER COMMENTS:    Okay for refill?  Please advise

## 2019-06-29 NOTE — Telephone Encounter (Signed)
Left message on voicemail to call office. Pt needs to contact pharmacy should have one refill left on both medications.

## 2019-07-01 ENCOUNTER — Other Ambulatory Visit: Payer: Self-pay

## 2019-07-01 ENCOUNTER — Encounter: Payer: Self-pay | Admitting: Neurology

## 2019-07-01 ENCOUNTER — Ambulatory Visit (INDEPENDENT_AMBULATORY_CARE_PROVIDER_SITE_OTHER): Payer: PRIVATE HEALTH INSURANCE | Admitting: Neurology

## 2019-07-01 VITALS — BP 182/117 | HR 97 | Ht 68.0 in | Wt 233.0 lb

## 2019-07-01 DIAGNOSIS — Z9989 Dependence on other enabling machines and devices: Secondary | ICD-10-CM

## 2019-07-01 DIAGNOSIS — G4733 Obstructive sleep apnea (adult) (pediatric): Secondary | ICD-10-CM

## 2019-07-01 NOTE — Patient Instructions (Signed)
Please continue using your autoPAP regularly. While your insurance requires that you use PAP at least 4 hours each night on 70% of the nights, I recommend, that you not skip any nights and use it throughout the night if you can. Getting used to PAP and staying with the treatment long term does take time and patience and discipline. Untreated obstructive sleep apnea when it is moderate to severe can have an adverse impact on cardiovascular health and raise her risk for heart disease, arrhythmias, hypertension, congestive heart failure, stroke and diabetes. Untreated obstructive sleep apnea causes sleep disruption, nonrestorative sleep, and sleep deprivation. This can have an impact on your day to day functioning and cause daytime sleepiness and impairment of cognitive function, memory loss, mood disturbance, and problems focussing. Using PAP regularly can improve these symptoms.  You can talk to [facility] regarding trying the nasal pillows. Please follow up in 6 months.   Please try to make more time to sleep.

## 2019-07-01 NOTE — Progress Notes (Signed)
Subjective:    Patient ID: Nathaniel Daugherty is a 43 y.o. male.  HPI     Interim history:   [name] is a 43 year old right-handed man with an underlying medical history of hypertension and obesity, who presents for follow-up consultation of his obstructive sleep apnea, after baseline sleep study and starting AutoPap therapy at home.  The patient is unaccompanied today.  I first met him on 03/11/2019 in virtual visit at the request of his primary care PA, at which time he reported snoring and significant daytime somnolence.  His Epworth sleepiness score was 24 at the time.  He was advised to proceed with sleep study testing.  He had a baseline sleep study on 04/25/2019.  I went over his test results with him.  His sleep efficiency was 93%, sleep latency 2.5 minutes, REM latency slightly reduced at 50.5 minutes.  He had a increased percentage of stage II sleep, and mildly increased percentage of REM sleep at 27.3%.  His AHI was in the moderate range at 23.1/h, supine AHI was in the severe range at 72.7/h, REM AHI also in the severe range at 50.3/h, average oxygen saturation was 94%, nadir was 83%.  He had no significant PLM's.  Given the COVID-19 pandemic and the sleep lab not conducting any CPAP titration studies at the time, he was advised to proceed with AutoPap therapy at home.    Today, 07/01/2019: I reviewed his AutoPap compliance data from 05/31/2019 through 06/29/2019 which is a total of 30 days, during which time he used his machine every night with percent used days greater than 4 hours at 90%, indicating excellent compliance but average usage of 4 hours and 34 minutes, residual AHI 0.8/h, leak acceptable with a 95th percentile at 11.8 L/min, 95th percentile of pressure at 10.7 cm with a range of 7 cm to 14 cm with EPR.  He reports that he is still trying to get used to using his AutoPap.  He reports That he has noticed some improvements already including no more snoring, less daytime  somnolence.  He would like to try a nasal pillows interface.  He has been using a nasal mask and it bothers him sometimes.  He is trying to lose weight.  He is motivated to continue with treatment.  He admits that he does not always get enough sleep.  He would like to be in bed between 830 and 9, he has to get up at 4, often it will be 11 until he goes to bed. He works as a Freight forwarder. ESS is 20.  The patient's allergies, current medications, family history, past medical history, past social history, past surgical history and problem list were reviewed and updated as appropriate.   Previously:   03/11/2019: I am conducting a virtual, video based new patient visit via [email or web address] in lieu of a face-to-face visit for evaluation of his sleep disorder, in particular, evaluation for underlying obstructive sleep apnea. The patient is unaccompanied today and joins via cell phone from home, I am in my office. He is referred by his primary care PA, Inda Coke, and I reviewed her note from 02/01/2019.   His Epworth sleepiness score is 24 out of 24, fatigue score is 15 out of 63. He admits to being sleepy for the past 3 or 4 years. He admits to dozing off while driving and even while at work. He reports no are accidents or problems at work. 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 Bedtime is generally around 8 and rise time between 3:30 and 4. He denies night to night nocturia or recurrent morning headaches recently. He believes his father has sleep apnea but is not sure if he has a CPAP machine. He has woken up with a sense of gasping for air and has been witnessed pauses in his breathing by his fiance. He lives with his fiance and fiancs young daughter. They have no pets in the household. He is a nonsmoker and drinks caffeine occasionally in the form of tea and has cut down on sodas. He drinks alcohol occasionally. He has a TV in the bedroom which tends to stay on all  night. His weight has been fluctuating. He is trying to work on weight loss.   His Past Medical History Is Significant For: Past Medical History:  Diagnosis Date  . Hypertension     His Past Surgical History Is Significant For: No past surgical history on file.  His Family History Is Significant For: Family History  Problem Relation Age of Onset  . Hypertension Mother   . Hypertension Father   . Prostate cancer Neg Hx   . Colon cancer Neg Hx     His Social History Is Significant For: Social History   Socioeconomic History  . Marital status: Single    Spouse name: Not on file  . Number of children: 4  . Years of education: Not on file  . Highest education level: Not on file  Occupational History  . Not on file  Social Needs  . Financial resource strain: Not on file  . Food insecurity    Worry: Not on file    Inability: Not on file  . Transportation needs    Medical: Not on file    Non-medical: Not on file  Tobacco Use  . Smoking status: Never Smoker  . Smokeless tobacco: Never Used  Substance and Sexual Activity  . Alcohol use: No  . Drug use: No  . Sexual activity: Yes    Birth control/protection: Condom  Lifestyle  . Physical activity    Days per week: Not on file    Minutes per session: Not on file  . Stress: Not on file  Relationships  . Social Herbalist on phone: Not on file    Gets together: Not on file    Attends religious service: Not on file    Active member of club or organization: Not on file    Attends meetings of clubs or organizations: Not on file    Relationship status: Not on file  Other Topics Concern  . Not on file  Social History Narrative  . Not on file    His Allergies Are:  No Known Allergies:   His Current Medications Are:  Outpatient Encounter Medications as of 07/01/2019  Medication Sig  . amLODipine (NORVASC) 10 MG tablet Take 1 tablet (10 mg total) by mouth daily.  . hydrochlorothiazide (HYDRODIURIL) 25 MG  tablet Take 1 tablet (25 mg total) by mouth daily.   No facility-administered encounter medications on file as of 07/01/2019.   :  Review of Systems:  Out of a complete 14 point review of systems, all are reviewed and negative with the exception of these symptoms as listed below: Review of Systems  Neurological:       Pt presents today to discuss his auto pap. Pt reports that he is still trying to get used to it.  Objective:  Neurological Exam  Physical Exam Physical Examination:   Vitals:   07/01/19 1500  BP: (!) 182/117  Pulse: 97    General Examination: The patient is a very pleasant 43 y.o. male in no acute distress. He appears well-developed and well-nourished and well groomed.   HEENT: Normocephalic, atraumatic, pupils are equal, round and reactive to light and accommodation. Corrective eyeglasses in place, extraocular tracking good, face is symmetric with normal facial animation, hearing grossly intact. Speech is clear with no dysarthria noted. There is no hypophonia. There is no lip, neck/head, jaw or voice tremor. Neck is supple with full range of passive and active motion. There are no carotid bruits on auscultation. Oropharynx exam reveals: mild mouth dryness, adequate dental hygiene and moderate airway crowding. Mallampati is class II. Tongue protrudes centrally and palate elevates symmetrically.   Chest: Clear to auscultation without wheezing, rhonchi or crackles noted.  Heart: S1+S2+0, regular and normal without murmurs, rubs or gallops noted.   Abdomen: Soft, non-tender and non-distended with normal bowel sounds appreciated on auscultation.  Extremities: There is no edema in the distal lower extremities bilaterally.   Skin: Warm and dry without trophic changes noted.  Musculoskeletal: exam reveals no obvious joint deformities, tenderness or joint swelling or erythema.   Neurologically:  Mental status: The patient is awake, alert and oriented in all 4 spheres.  His immediate and remote memory, attention, language skills and fund of knowledge are appropriate. There is no evidence of aphasia, agnosia, apraxia or anomia. Speech is clear with normal prosody and enunciation. Thought process is linear. Mood is normal and affect is normal.  Cranial nerves II - XII are as described above under HEENT exam. In addition: shoulder shrug is normal with equal shoulder height noted. Motor exam: Normal bulk, strength and tone is noted. There is no tremor. Fine motor skills and coordination: grossly intact.  Cerebellar testing: No dysmetria or intention tremor. There is no truncal or gait ataxia.  Sensory exam: intact to light touch in the upper and lower extremities.  Gait, station and balance: He stands easily. No veering to one side is noted. No leaning to one side is noted. Posture is age-appropriate and stance is narrow based. Gait shows normal stride length and normal pace. No problems turning are noted.  Assessment and Plan:  In summary, Nathaniel Daugherty is a very pleasant 43 y.o.-year old male with an underlying medical history of hypertension and obesity, who Presents for follow-up consultation of his obstructive sleep apnea, after a baseline sleep study on 04/25/2019, which indicated moderate to severe obstructive sleep apnea.  He has established treatment on 05/10/2019 with AutoPap.  He is compliant with treatment, average usage just 4-1/2 hours, he is encouraged to increase his sleep time.  He is motivated to continue with treatment.  He indicates improvement in his daytime somnolence and sleep quality and no longer snores.He is working on [employer] lossHe would like to try a nasal Pillows. He is advised to follow-up routinely in 6 months with a nurse practitioner.  He is encouraged to call our office with any interim questions or concerns.  I answered all his questions today and he was in agreement. I spent 25 minutes in total face-to-face time with the patient, more than  50% of which was spent in counseling and coordination of care, reviewing test results, reviewing medication and discussing or reviewing the diagnosis of OSA, its prognosis and treatment options. Pertinent laboratory and imaging test results that were available during this  visit with the patient were reviewed by me and considered in my medical decision making (see chart for details).

## 2019-09-03 ENCOUNTER — Other Ambulatory Visit: Payer: Self-pay

## 2019-09-03 ENCOUNTER — Encounter: Payer: Self-pay | Admitting: Physician Assistant

## 2019-09-03 ENCOUNTER — Telehealth: Payer: Self-pay | Admitting: Physician Assistant

## 2019-09-03 ENCOUNTER — Ambulatory Visit (INDEPENDENT_AMBULATORY_CARE_PROVIDER_SITE_OTHER): Payer: PRIVATE HEALTH INSURANCE | Admitting: Physician Assistant

## 2019-09-03 VITALS — BP 126/90 | HR 113 | Temp 97.5°F | Ht 68.0 in | Wt 235.0 lb

## 2019-09-03 DIAGNOSIS — I10 Essential (primary) hypertension: Secondary | ICD-10-CM | POA: Diagnosis not present

## 2019-09-03 NOTE — Telephone Encounter (Signed)
Needs in office visit so we can check blood pressure and discuss.

## 2019-09-03 NOTE — Telephone Encounter (Signed)
Please see message. °

## 2019-09-03 NOTE — Telephone Encounter (Signed)
See note  Copied from [place] [id number]. Topic: General - Inquiry >> Sep 03, 2019  8:22 AM Nathaniel Daugherty, NT wrote: Reason for CRM: Patient called in stating he is needing a letter from PCP saying it is okay for him to get treatment from dentist due to having a high BP. Patient said it can be sent to mychart. Please advise.

## 2019-09-03 NOTE — Progress Notes (Signed)
Nathaniel Daugherty is a 43 y.o. male is here to follow up on blood pressure.  I acted as a Education administrator for [employer], PA-C Anselmo Pickler, LPN  History of Present Illness:   Chief Complaint  Patient presents with  . Hypertension    HPI  Hypertension Pt following up today on blood pressure and needs a note for the dentist to say okay for a procedure due to high blood pressure. Pt has been checking blood pressure daily. Bp has been averaging systolic 409'W-119, diastolic 14'N - 91. Pt is c/o muscle cramps off and on since medication -- can be in hands stomach. Lasts about 2-3 seconds when it happens.  Currently taking Amlodipine mg daily and HCTZ  25 mg daily. Pt denies headaches, dizziness, blurred vision, chest pain, SOB or lower leg edema. Denies excessive caffeine intake, stimulant usage, excessive alcohol intake or increase in salt consumption.  BP Readings from Last 3 Encounters:  09/03/19 126/90  07/01/19 (!) 182/117  03/31/19 (!) 149/108   He is trying to get used to his CPAP machine that he was recently prescribed.   There are no preventive care reminders to display for this patient.  Past Medical History:  Diagnosis Date  . Hypertension      Social History   Socioeconomic History  . Marital status: Single    Spouse name: Not on file  . Number of children: 4  . Years of education: Not on file  . Highest education level: Not on file  Occupational History  . Not on file  Social Needs  . Financial resource strain: Not on file  . Food insecurity    Worry: Not on file    Inability: Not on file  . Transportation needs    Medical: Not on file    Non-medical: Not on file  Tobacco Use  . Smoking status: Never Smoker  . Smokeless tobacco: Never Used  Substance and Sexual Activity  . Alcohol use: No  . Drug use: No  . Sexual activity: Yes    Birth control/protection: Condom  Lifestyle  . Physical activity    Days per week: Not on file    Minutes per  session: Not on file  . Stress: Not on file  Relationships  . Social Herbalist on phone: Not on file    Gets together: Not on file    Attends religious service: Not on file    Active member of club or organization: Not on file    Attends meetings of clubs or organizations: Not on file    Relationship status: Not on file  . Intimate partner violence    Fear of current or ex partner: Not on file    Emotionally abused: Not on file    Physically abused: Not on file    Forced sexual activity: Not on file  Other Topics Concern  . Not on file  Social History Narrative  . Not on file    History reviewed. No pertinent surgical history.  Family History  Problem Relation Age of Onset  . Hypertension Mother   . Hypertension Father   . Prostate cancer Neg Hx   . Colon cancer Neg Hx     PMHx, SurgHx, SocialHx, FamHx, Medications, and Allergies were reviewed in the Visit Navigator and updated as appropriate.   Patient Active Problem List   Diagnosis Date Noted  . Obesity 01/18/2019  . Essential hypertension 01/18/2019    Social History   Tobacco  Use  . Smoking status: Never Smoker  . Smokeless tobacco: Never Used  Substance Use Topics  . Alcohol use: No  . Drug use: No    Current Medications and Allergies:    Current Outpatient Medications:  .  amLODipine (NORVASC) 10 MG tablet, Take 1 tablet (10 mg total) by mouth daily., Disp: 90 tablet, Rfl: 1 .  hydrochlorothiazide (HYDRODIURIL) 25 MG tablet, Take 1 tablet (25 mg total) by mouth daily., Disp: 90 tablet, Rfl: 1  No Known Allergies  Review of Systems   ROS Negative unless otherwise specified per HPI.  Vitals:   Vitals:   09/03/19 1533 09/03/19 1542  BP: 130/90 126/90  Pulse: (!) 114 (!) 113  Temp: (!) 97.5 F (36.4 C)   TempSrc: Temporal   SpO2: 95%   Weight: 235 lb (106.6 kg)   Height: 5\' 8"  (1.727 m)      Body mass index is 35.73 kg/m.   Physical Exam:    Physical Exam Vitals signs  and nursing note reviewed.  Constitutional:      General: He is not in acute distress.    Appearance: He is well-developed. He is not ill-appearing or toxic-appearing.  Cardiovascular:     Rate and Rhythm: Normal rate and regular rhythm.     Pulses: Normal pulses.     Heart sounds: Normal heart sounds, S1 normal and S2 normal.     Comments: No LE edema Pulmonary:     Effort: Pulmonary effort is normal.     Breath sounds: Normal breath sounds.  Skin:    General: Skin is warm and dry.  Neurological:     Mental Status: He is alert.     GCS: GCS eye subscore is 4. GCS verbal subscore is 5. GCS motor subscore is 6.  Psychiatric:        Speech: Speech normal.        Behavior: Behavior normal. Behavior is cooperative.      Assessment and Plan:    Nathaniel Daugherty was seen today for hypertension.  Diagnoses and all orders for this visit:  Essential hypertension Improving. Continue Norvasc 10 mg and HCTZ 25 mg. I do think his blood pressure will continue to improve as he gets more used to and compliant with his CPAP machine.  Follow-up in 3 to 6 months for physical, sooner if concerns.  Also checking a BMP because of his "cramps". -     Cancel: Basic metabolic panel; Future -     Basic metabolic panel; Future -     Basic metabolic panel  . Reviewed expectations re: course of current medical issues. . Discussed self-management of symptoms. . Outlined signs and symptoms indicating need for more acute intervention. . Patient verbalized understanding and all questions were answered. . See orders for this visit as documented in the electronic medical record. . Patient received an After Visit Summary.  CMA or LPN served as scribe during this visit. History, Physical, and Plan performed by medical provider. The above documentation has been reviewed and is accurate and complete.   , PA-C [place], [place] 09/03/2019  Follow-up: No follow-ups on file.

## 2019-09-03 NOTE — Patient Instructions (Addendum)
It was great to see you!  Continue blood pressure medication.  Take care,  Inda Coke PA-C

## 2019-09-03 NOTE — Telephone Encounter (Signed)
Left message on voicemail to call office. Pt needs to schedule office visit to check blood pressure and discuss issues per [facility].

## 2019-09-03 NOTE — Telephone Encounter (Signed)
Pt called back and scheduled today

## 2019-09-04 LAB — BASIC METABOLIC PANEL
BUN: 15 mg/dL (ref 7–25)
CO2: 22 mmol/L (ref 20–32)
Calcium: 9.8 mg/dL (ref 8.6–10.3)
Chloride: 99 mmol/L (ref 98–110)
Creat: 1.21 mg/dL (ref 0.60–1.35)
Glucose, Bld: 76 mg/dL (ref 65–99)
Potassium: 4.2 mmol/L (ref 3.5–5.3)
Sodium: 135 mmol/L (ref 135–146)

## 2019-09-27 ENCOUNTER — Other Ambulatory Visit (HOSPITAL_COMMUNITY)
Admission: RE | Admit: 2019-09-27 | Discharge: 2019-09-27 | Disposition: A | Discharge status: Home / Self Care | Payer: PRIVATE HEALTH INSURANCE | Source: Ambulatory Visit | Attending: Physician Assistant | Admitting: Physician Assistant

## 2019-09-27 ENCOUNTER — Other Ambulatory Visit: Payer: Self-pay

## 2019-09-27 ENCOUNTER — Ambulatory Visit (INDEPENDENT_AMBULATORY_CARE_PROVIDER_SITE_OTHER): Payer: PRIVATE HEALTH INSURANCE | Admitting: Physician Assistant

## 2019-09-27 ENCOUNTER — Encounter: Payer: Self-pay | Admitting: Physician Assistant

## 2019-09-27 VITALS — BP 126/84 | HR 88 | Temp 98.1°F | Ht 68.0 in | Wt 233.4 lb

## 2019-09-27 DIAGNOSIS — Z0000 Encounter for general adult medical examination without abnormal findings: Secondary | ICD-10-CM | POA: Diagnosis not present

## 2019-09-27 DIAGNOSIS — I10 Essential (primary) hypertension: Secondary | ICD-10-CM

## 2019-09-27 DIAGNOSIS — Z136 Encounter for screening for cardiovascular disorders: Secondary | ICD-10-CM

## 2019-09-27 DIAGNOSIS — Z13220 Encounter for screening for lipoid disorders: Secondary | ICD-10-CM | POA: Diagnosis not present

## 2019-09-27 DIAGNOSIS — E669 Obesity, unspecified: Secondary | ICD-10-CM | POA: Diagnosis not present

## 2019-09-27 DIAGNOSIS — Z113 Encounter for screening for infections with a predominantly sexual mode of transmission: Secondary | ICD-10-CM | POA: Insufficient documentation

## 2019-09-27 DIAGNOSIS — G4733 Obstructive sleep apnea (adult) (pediatric): Secondary | ICD-10-CM

## 2019-09-27 NOTE — Progress Notes (Signed)
I acted as a Neurosurgeon for [employer], PA-C Corky Mull, LPN  Subjective:    Nathaniel Daugherty is a 43 y.o. male and is here for a comprehensive physical exam.  HPI  There are no preventive care reminders to display for this patient.  Acute Concerns: NONE  Chronic Issues: HTN -- Currently taking Norvasc 10 mg, and HCTZ 25 mg. Patient denies chest pain, SOB, blurred vision, dizziness, unusual headaches, lower leg swelling. Patient is compliant with medication. Denies excessive caffeine intake, stimulant usage, excessive alcohol intake, or increase in salt consumption.  BP Readings from Last 3 Encounters:  09/27/19 126/84  09/03/19 126/90  07/01/19 (!) 182/117   OSA -- uses CPAP often, tries to use regularly.  STD screening -- would like screening today -- denies any concerning symptoms  Health Maintenance: Immunizations -- UTD, declines Flu Colonoscopy -- N/A PSA -- N/A Diet -- eats a lot of "baked foods" Caffeine intake -- Dr. Reino Kent occasionally Sleep habits -- sleeps well when  Exercise -- tries to walk 6k steps daily Weight -- Weight: 233 lb 6.1 oz (105.9 kg)  Weight history Wt Readings from Last 10 Encounters:  09/27/19 233 lb 6.1 oz (105.9 kg)  09/03/19 235 lb (106.6 kg)  07/01/19 233 lb (105.7 kg)  03/31/19 221 lb (100.2 kg)  02/01/19 230 lb (104.3 kg)  01/18/19 224 lb (101.6 kg)   Mood -- good Tobacco use -- denies Alcohol use --- no excessive intake   Depression screen PHQ 2/9 09/27/2019  Decreased Interest 0  Down, Depressed, Hopeless 0  PHQ - 2 Score 0     Other providers/specialists: Patient Care Team: Jarold Motto, [name] as PCP - General (Physician Assistant)   PMHx, SurgHx, SocialHx, Medications, and Allergies were reviewed in the Visit Navigator and updated as appropriate.   Past Medical History:  Diagnosis Date  . Hypertension     History reviewed. No pertinent surgical history.   Family History  Problem Relation Age of  Onset  . Hypertension Mother   . Hypertension Father   . Prostate cancer Neg Hx   . Colon cancer Neg Hx     Social History   Tobacco Use  . Smoking status: Never Smoker  . Smokeless tobacco: Never Used  Substance Use Topics  . Alcohol use: No  . Drug use: No    Review of Systems:   Review of Systems  Constitutional: Negative for chills, fever, malaise/fatigue and weight loss.  HENT: Negative for hearing loss, sinus pain and sore throat.   Eyes: Negative for blurred vision.  Respiratory: Negative for cough and shortness of breath.   Cardiovascular: Negative for chest pain, palpitations and leg swelling.  Gastrointestinal: Negative for abdominal pain, constipation, diarrhea, heartburn, nausea and vomiting.  Genitourinary: Negative for dysuria, frequency and urgency.  Musculoskeletal: Negative for back pain, myalgias and neck pain.  Skin: Negative for itching and rash.  Neurological: Negative for dizziness, tingling, seizures, loss of consciousness and headaches.  Endo/Heme/Allergies: Negative for polydipsia.  Psychiatric/Behavioral: Negative for depression. The patient is not nervous/anxious.   All other systems reviewed and are negative.   Objective:   Vitals:   09/27/19 1446  BP: 126/84  Pulse: 88  Temp: 98.1 F (36.7 C)  SpO2: 97%   Body mass index is 35.49 kg/m.  General Appearance:  Alert, cooperative, no distress, appears stated age  Head:  Normocephalic, without obvious abnormality, atraumatic  Eyes:  PERRL, conjunctiva/corneas clear, EOM's intact, fundi benign, both eyes  Ears:  Normal TM's and external ear canals, both ears  Nose: Nares normal, septum midline, mucosa normal, no drainage    or sinus tenderness  Throat: Lips, mucosa, and tongue normal; teeth and gums normal  Neck: Supple, symmetrical, trachea midline, no adenopathy; thyroid:  No enlargement/tenderness/nodules; no carotit bruit or JVD  Back:   Symmetric, no curvature, ROM normal, no CVA  tenderness  Lungs:   Clear to auscultation bilaterally, respirations unlabored  Chest wall:  No tenderness or deformity  Heart:  Regular rate and rhythm, S1 and S2 normal, no murmur, rub   or gallop  Abdomen:   Soft, non-tender, bowel sounds active all four quadrants, no masses, no organomegaly  Extremities: Extremities normal, atraumatic, no cyanosis or edema  Prostate: Not done.   Skin: Skin color, texture, turgor normal, no rashes or lesions  Lymph nodes: Cervical, supraclavicular, and axillary nodes normal  Neurologic: CNII-XII grossly intact. Normal strength, sensation and reflexes throughout    Assessment/Plan:   [name] was seen today for annual exam.  Diagnoses and all orders for this visit:  Routine physical examination Today patient counseled on age appropriate routine health concerns for screening and prevention, each reviewed and up to date or declined. Immunizations reviewed and up to date or declined. Labs ordered and reviewed. Risk factors for depression reviewed and negative. Hearing function and visual acuity are intact. ADLs screened and addressed as needed. Functional ability and level of safety reviewed and appropriate. Education, counseling and referrals performed based on assessed risks today. Patient provided with a copy of personalized plan for preventive services. -     CBC with Differential/Platelet -     Comprehensive metabolic panel  Encounter for lipid screening for cardiovascular disease -     Lipid panel  Essential hypertension/OSA Currently well controlled with Norvasc 10 mg and hydrochlorothiazide 25 mg daily.  Continue current regimen.  Also encouraged regular use of CPAP.  Obesity, unspecified classification, unspecified obesity type, unspecified whether serious comorbidity present Encourage continued exercise.  We also made goals for him to reduce his Dr. Reino KentPepper intake.  Continue watching fried food intake as well. -     Hemoglobin A1c  Screening  examination for STD (sexually transmitted disease) -     Urine cytology ancillary only([place]) -     HIV antibody (with reflex)   Well Adult Exam: Labs ordered: Yes. Patient counseling was done. See below for items discussed. Discussed the patient's BMI.  The BMI is not in the acceptable range; BMI management plan is completed Follow up in 6 months.  Patient Counseling: [x]   Nutrition: Stressed importance of moderation in sodium/caffeine intake, saturated fat and cholesterol, caloric balance, sufficient intake of fresh fruits, vegetables, and fiber.  [x]   Stressed the importance of regular exercise.   []   Substance Abuse: Discussed cessation/primary prevention of tobacco, alcohol, or other drug use; driving or other dangerous activities under the influence; availability of treatment for abuse.   [x]   Injury prevention: Discussed safety belts, safety helmets, smoke detector, smoking near bedding or upholstery.   []   Sexuality: Discussed sexually transmitted diseases, partner selection, use of condoms, avoidance of unintended pregnancy  and contraceptive alternatives.   [x]   Dental health: Discussed importance of regular tooth brushing, flossing, and dental visits.  [x]   Health maintenance and immunizations reviewed. Please refer to Health maintenance section.    CMA or LPN served as scribe during this visit. History, Physical, and Plan performed by medical provider. The above documentation has been  reviewed and is accurate and complete.   Inda Coke, PA-C [place]

## 2019-09-27 NOTE — Patient Instructions (Signed)
It was great to see you!  Please go to the lab for blood work.   GOALS: 1. Work on getting your steps in daily 2. Reduce the Dr. Malachi Bonds, choose other drinks, as we discussed 3. Consider adding in some strength training  Our office will call you with your results unless you have chosen to receive results via [place].  If your blood work is normal we will follow-up each year for physicals and as scheduled for chronic medical problems.  If anything is abnormal we will treat accordingly and get you in for a follow-up.  Take care,  [facility] Maintenance, Male Adopting a healthy lifestyle and getting preventive care are important in promoting health and wellness. Ask your health care provider about:  The right schedule for you to have regular tests and exams.  Things you can do on your own to prevent diseases and keep yourself healthy. What should I know about diet, weight, and exercise? Eat a healthy diet   Eat a diet that includes plenty of vegetables, fruits, low-fat dairy products, and lean protein.  Do not eat a lot of foods that are high in solid fats, added sugars, or sodium. Maintain a healthy weight Body mass index (BMI) is a measurement that can be used to identify possible weight problems. It estimates body fat based on height and weight. Your health care provider can help determine your BMI and help you achieve or maintain a healthy weight. Get regular exercise Get regular exercise. This is one of the most important things you can do for your health. Most adults should:  Exercise for at least 150 minutes each week. The exercise should increase your heart rate and make you sweat (moderate-intensity exercise).  Do strengthening exercises at least twice a week. This is in addition to the moderate-intensity exercise.  Spend less time sitting. Even light physical activity can be beneficial. Watch cholesterol and blood lipids Have your blood tested for lipids and  cholesterol at 41 years of age, then have this test every 5 years. You may need to have your cholesterol levels checked more often if:  Your lipid or cholesterol levels are high.  You are older than 41 years of age.  You are at high risk for heart disease. What should I know about cancer screening? Many types of cancers can be detected early and may often be prevented. Depending on your health history and family history, you may need to have cancer screening at various ages. This may include screening for:  Colorectal cancer.  Prostate cancer.  Skin cancer.  Lung cancer. What should I know about heart disease, diabetes, and high blood pressure? Blood pressure and heart disease  High blood pressure causes heart disease and increases the risk of stroke. This is more likely to develop in people who have high blood pressure readings, are of African descent, or are overweight.  Talk with your health care provider about your target blood pressure readings.  Have your blood pressure checked: ? Every 3-5 years if you are 103-35 years of age. ? Every year if you are 40 years old or older.  If you are between the ages of 74 and 37 and are a current or former smoker, ask your health care provider if you should have a one-time screening for abdominal aortic aneurysm (AAA). Diabetes Have regular diabetes screenings. This checks your fasting blood sugar level. Have the screening done:  Once every three years after age 10 if you are  at a normal weight and have a low risk for diabetes.  More often and at a younger age if you are overweight or have a high risk for diabetes. What should I know about preventing infection? Hepatitis B If you have a higher risk for hepatitis B, you should be screened for this virus. Talk with your health care provider to find out if you are at risk for hepatitis B infection. Hepatitis C Blood testing is recommended for:  Everyone born from [birth year] through 1963.   Anyone with known risk factors for hepatitis C. Sexually transmitted infections (STIs)  You should be screened each year for STIs, including gonorrhea and chlamydia, if: ? You are sexually active and are younger than 41 years of age. ? You are older than 41 years of age and your health care provider tells you that you are at risk for this type of infection. ? Your sexual activity has changed since you were last screened, and you are at increased risk for chlamydia or gonorrhea. Ask your health care provider if you are at risk.  Ask your health care provider about whether you are at high risk for HIV. Your health care provider may recommend a prescription medicine to help prevent HIV infection. If you choose to take medicine to prevent HIV, you should first get tested for HIV. You should then be tested every 3 months for as long as you are taking the medicine. Follow these instructions at home: Lifestyle  Do not use any products that contain nicotine or tobacco, such as cigarettes, e-cigarettes, and chewing tobacco. If you need help quitting, ask your health care provider.  Do not use street drugs.  Do not share needles.  Ask your health care provider for help if you need support or information about quitting drugs. Alcohol use  Do not drink alcohol if your health care provider tells you not to drink.  If you drink alcohol: ? Limit how much you have to 0-2 drinks a day. ? Be aware of how much alcohol is in your drink. In the [place] one drink equals one 12 oz bottle of beer (355 mL), one 5 oz glass of wine (148 mL), or one 1 oz glass of hard liquor (44 mL). General instructions  Schedule regular health, dental, and eye exams.  Stay current with your vaccines.  Tell your health care provider if: ? You often feel depressed. ? You have ever been abused or do not feel safe at home. Summary  Adopting a healthy lifestyle and getting preventive care are important in promoting health and  wellness.  Follow your health care provider's instructions about healthy diet, exercising, and getting tested or screened for diseases.  Follow your health care provider's instructions on monitoring your cholesterol and blood pressure. This information is not intended to replace advice given to you by your health care provider. Make sure you discuss any questions you have with your health care provider. Document Released: 04/25/2008 Document Revised: 10/21/2018 Document Reviewed: 10/21/2018 Elsevier Patient Education  2020 [employer].

## 2019-09-28 LAB — COMPREHENSIVE METABOLIC PANEL
ALT: 16 U/L (ref 0–53)
AST: 14 U/L (ref 0–37)
Albumin: 4.5 g/dL (ref 3.5–5.2)
Alkaline Phosphatase: 63 U/L (ref 39–117)
BUN: 14 mg/dL (ref 6–23)
CO2: 30 mEq/L (ref 19–32)
Calcium: 9.4 mg/dL (ref 8.4–10.5)
Chloride: 103 mEq/L (ref 96–112)
Creatinine, Ser: 1.19 mg/dL (ref 0.40–1.50)
GFR: 81.53 mL/min (ref >60.00)
Glucose, Bld: 101 mg/dL — ABNORMAL HIGH (ref 70–99)
Potassium: 4.1 mEq/L (ref 3.5–5.1)
Sodium: 139 mEq/L (ref 135–145)
Total Bilirubin: 0.7 mg/dL (ref 0.2–1.2)
Total Protein: 7.7 g/dL (ref 6.0–8.3)

## 2019-09-28 LAB — CBC WITH DIFFERENTIAL/PLATELET
Basophils Absolute: 0 10*3/uL (ref 0.0–0.1)
Basophils Relative: 0.3 % (ref 0.0–3.0)
Eosinophils Absolute: 0.2 10*3/uL (ref 0.0–0.7)
Eosinophils Relative: 3.6 % (ref 0.0–5.0)
HCT: 45.6 % (ref 39.0–52.0)
Hemoglobin: 15.2 g/dL (ref 13.0–17.0)
Lymphocytes Relative: 50.9 % — ABNORMAL HIGH (ref 12.0–46.0)
Lymphs Abs: 2.9 10*3/uL (ref 0.7–4.0)
MCHC: 33.4 g/dL (ref 30.0–36.0)
MCV: 92.9 fl (ref 78.0–100.0)
Monocytes Absolute: 0.8 10*3/uL (ref 0.1–1.0)
Monocytes Relative: 13.4 % — ABNORMAL HIGH (ref 3.0–12.0)
Neutro Abs: 1.8 10*3/uL (ref 1.4–7.7)
Neutrophils Relative %: 31.8 % — ABNORMAL LOW (ref 43.0–77.0)
Platelets: 299 10*3/uL (ref 150.0–400.0)
RBC: 4.91 Mil/uL (ref 4.22–5.81)
RDW: 13 % (ref 11.5–15.5)
WBC: 5.6 10*3/uL (ref 4.0–10.5)

## 2019-09-28 LAB — LIPID PANEL
Cholesterol: 205 mg/dL — ABNORMAL HIGH (ref 0–200)
HDL: 38.5 mg/dL — ABNORMAL LOW (ref >39.00)
LDL Cholesterol: 141 mg/dL — ABNORMAL HIGH (ref 0–99)
NonHDL: 166.75
Total CHOL/HDL Ratio: 5
Triglycerides: 130 mg/dL (ref 0.0–149.0)
VLDL: 26 mg/dL (ref 0.0–40.0)

## 2019-09-28 LAB — HEMOGLOBIN A1C: Hgb A1c MFr Bld: 6.1 % (ref 4.6–6.5)

## 2019-09-28 LAB — HIV ANTIBODY (ROUTINE TESTING W REFLEX): HIV 1&2 Ab, 4th Generation: NONREACTIVE

## 2019-09-29 ENCOUNTER — Encounter: Payer: Self-pay | Admitting: Physician Assistant

## 2019-09-29 DIAGNOSIS — E8881 Metabolic syndrome: Secondary | ICD-10-CM | POA: Insufficient documentation

## 2019-09-29 DIAGNOSIS — E785 Hyperlipidemia, unspecified: Secondary | ICD-10-CM | POA: Insufficient documentation

## 2019-09-29 LAB — URINE CYTOLOGY ANCILLARY ONLY
Chlamydia: NEGATIVE
Comment: NEGATIVE
Comment: NEGATIVE
Comment: NORMAL
Neisseria Gonorrhea: NEGATIVE
Trichomonas: NEGATIVE

## 2019-10-30 ENCOUNTER — Other Ambulatory Visit: Payer: Self-pay | Admitting: Physician Assistant

## 2020-01-03 ENCOUNTER — Other Ambulatory Visit: Payer: Self-pay

## 2020-01-03 ENCOUNTER — Ambulatory Visit (INDEPENDENT_AMBULATORY_CARE_PROVIDER_SITE_OTHER): Payer: PRIVATE HEALTH INSURANCE | Admitting: Family Medicine

## 2020-01-03 ENCOUNTER — Encounter: Payer: Self-pay | Admitting: Family Medicine

## 2020-01-03 VITALS — BP 156/102 | HR 86 | Temp 97.0°F | Ht 68.0 in | Wt 232.6 lb

## 2020-01-03 DIAGNOSIS — Z9989 Dependence on other enabling machines and devices: Secondary | ICD-10-CM | POA: Diagnosis not present

## 2020-01-03 DIAGNOSIS — G4733 Obstructive sleep apnea (adult) (pediatric): Secondary | ICD-10-CM | POA: Diagnosis not present

## 2020-01-03 NOTE — Patient Instructions (Signed)
Please continue using your CPAP regularly. While your insurance requires that you use CPAP at least 4 hours each night on 70% of the nights, I recommend, that you not skip any nights and use it throughout the night if you can. Getting used to CPAP and staying with the treatment long term does take time and patience and discipline. Untreated obstructive sleep apnea when it is moderate to severe can have an adverse impact on cardiovascular health and raise her risk for heart disease, arrhythmias, hypertension, congestive heart failure, stroke and diabetes. Untreated obstructive sleep apnea causes sleep disruption, nonrestorative sleep, and sleep deprivation. This can have an impact on your day to day functioning and cause daytime sleepiness and impairment of cognitive function, memory loss, mood disturbance, and problems focussing. Using CPAP regularly can improve these symptoms.   Follow up in 3 months   Sleep Apnea Sleep apnea affects breathing during sleep. It causes breathing to stop for a short time or to become shallow. It can also increase the risk of:  Heart attack.  Stroke.  Being very overweight (obese).  Diabetes.  Heart failure.  Irregular heartbeat. The goal of treatment is to help you breathe normally again. What are the causes? There are three kinds of sleep apnea:  Obstructive sleep apnea. This is caused by a blocked or collapsed airway.  Central sleep apnea. This happens when the brain does not send the right signals to the muscles that control breathing.  Mixed sleep apnea. This is a combination of obstructive and central sleep apnea. The most common cause of this condition is a collapsed or blocked airway. This can happen if:  Your throat muscles are too relaxed.  Your tongue and tonsils are too large.  You are overweight.  Your airway is too small. What increases the risk?  Being overweight.  Smoking.  Having a small airway.  Being older.  Being  male.  Drinking alcohol.  Taking medicines to calm yourself (sedatives or tranquilizers).  Having family members with the condition. What are the signs or symptoms?  Trouble staying asleep.  Being sleepy or tired during the day.  Getting angry a lot.  Loud snoring.  Headaches in the morning.  Not being able to focus your mind (concentrate).  Forgetting things.  Less interest in sex.  Mood swings.  Personality changes.  Feelings of sadness (depression).  Waking up a lot during the night to pee (urinate).  Dry mouth.  Sore throat. How is this diagnosed?  Your medical history.  A physical exam.  A test that is done when you are sleeping (sleep study). The test is most often done in a sleep lab but may also be done at home. How is this treated?   Sleeping on your side.  Using a medicine to get rid of mucus in your nose (decongestant).  Avoiding the use of alcohol, medicines to help you relax, or certain pain medicines (narcotics).  Losing weight, if needed.  Changing your diet.  Not smoking.  Using a machine to open your airway while you sleep, such as: ? An oral appliance. This is a mouthpiece that shifts your lower jaw forward. ? A CPAP device. This device blows air through a mask when you breathe out (exhale). ? An EPAP device. This has valves that you put in each nostril. ? A BPAP device. This device blows air through a mask when you breathe in (inhale) and breathe out.  Having surgery if other treatments do not work. It is   important to get treatment for sleep apnea. Without treatment, it can lead to:  High blood pressure.  Coronary artery disease.  In men, not being able to have an erection (impotence).  Reduced thinking ability. Follow these instructions at home: Lifestyle  Make changes that your doctor recommends.  Eat a healthy diet.  Lose weight if needed.  Avoid alcohol, medicines to help you relax, and some pain  medicines.  Do not use any products that contain nicotine or tobacco, such as cigarettes, e-cigarettes, and chewing tobacco. If you need help quitting, ask your doctor. General instructions  Take over-the-counter and prescription medicines only as told by your doctor.  If you were given a machine to use while you sleep, use it only as told by your doctor.  If you are having surgery, make sure to tell your doctor you have sleep apnea. You may need to bring your device with you.  Keep all follow-up visits as told by your doctor. This is important. Contact a doctor if:  The machine that you were given to use during sleep bothers you or does not seem to be working.  You do not get better.  You get worse. Get help right away if:  Your chest hurts.  You have trouble breathing in enough air.  You have an uncomfortable feeling in your back, arms, or stomach.  You have trouble talking.  One side of your body feels weak.  A part of your face is hanging down. These symptoms may be an emergency. Do not wait to see if the symptoms will go away. Get medical help right away. Call your local emergency services (911 in the [place] Do not drive yourself to the hospital. Summary  This condition affects breathing during sleep.  The most common cause is a collapsed or blocked airway.  The goal of treatment is to help you breathe normally while you sleep. This information is not intended to replace advice given to you by your health care provider. Make sure you discuss any questions you have with your health care provider. Document Revised: 08/14/2018 Document Reviewed: 06/23/2018 Elsevier Patient Education  2020 Elsevier Inc.  

## 2020-01-03 NOTE — Progress Notes (Addendum)
PATIENT: Nathaniel Daugherty DOB: 1976/07/31  REASON FOR VISIT: follow up HISTORY FROM: patient  Chief Complaint  Patient presents with  . Follow-up    Rm8. Alone. States that the CPAP works well. However the full face mask is uncomfortable and would like to try the nasal piece.     HISTORY OF PRESENT ILLNESS: Today 01/04/20 Nathaniel Daugherty is a 44 y.o. male here today for follow up of OSA on CPAP. He was started on auto PAP over the summer. He was seen for initial compliance review and was doing fairly well. He requested to try a nasal pillow and wished to continue working on getting more sleep and losing weight. He did not hear back from anyone regarding replacing mask. He admits that he has not been as compliant with usage due to feeling that FFM is uncomfortable. He wishes to resume therapy consistently as he notes significant improvement in sleep quality and daytime energy levels. He is requesting to reorder mask fitting.   Compliance report dated 11/05/2019 through 01/03/2020 reveals that of the last 60 days he used CPAP 31 days for compliance of 52%.  He used CPAP 14 of the last 60 days for greater than 4 hours for compliance of 23%.  Average usage was 3 hours and 48 minutes.  Residual AHI was 0.9 on 7 to 14 cm of water and an EPR of 3.  There was no significant leak noted.  He does note that BP has been elevated today. He usually takes HCTZ '25mg'$  and amlodipine '10mg'$  in the mornings. He forgot his medication this morning. He does endorse a slight headache today. No chest pain, shob, numbness, tingling or dizziness.   HISTORY: (copied from Dr Guadelupe Sabin note on 07/01/2019)  [name] is a 44 year old right-handed man with an underlying medical history of hypertension and obesity, who presents for follow-up consultation of his obstructive sleep apnea, after baseline sleep study and starting AutoPap therapy at home.  The patient is unaccompanied today.  I first met him on  03/11/2019 in virtual visit at the request of his primary care PA, at which time he reported snoring and significant daytime somnolence.  His Epworth sleepiness score was 24 at the time.  He was advised to proceed with sleep study testing.  He had a baseline sleep study on 04/25/2019.  I went over his test results with him.  His sleep efficiency was 93%, sleep latency 2.5 minutes, REM latency slightly reduced at 50.5 minutes.  He had a increased percentage of stage II sleep, and mildly increased percentage of REM sleep at 27.3%.  His AHI was in the moderate range at 23.1/h, supine AHI was in the severe range at 72.7/h, REM AHI also in the severe range at 50.3/h, average oxygen saturation was 94%, nadir was 83%.  He had no significant PLM's.  Given the COVID-19 pandemic and the sleep lab not conducting any CPAP titration studies at the time, he was advised to proceed with AutoPap therapy at home.    Today, 07/01/2019: I reviewed his AutoPap compliance data from 05/31/2019 through 06/29/2019 which is a total of 30 days, during which time he used his machine every night with percent used days greater than 4 hours at 90%, indicating excellent compliance but average usage of 4 hours and 34 minutes, residual AHI 0.8/h, leak acceptable with a 95th percentile at 11.8 L/min, 95th percentile of pressure at 10.7 cm with a range of 7 cm to 14 cm with EPR.  He reports that he is still trying to get used to using his AutoPap.  He reports That he has noticed some improvements already including no more snoring, less daytime somnolence.  He would like to try a nasal pillows interface.  He has been using a nasal mask and it bothers him sometimes.  He is trying to lose weight.  He is motivated to continue with treatment.  He admits that he does not always get enough sleep.  He would like to be in bed between 830 and 9, he has to get up at 4, often it will be 11 until he goes to bed. He works as a Freight forwarder. ESS is 20.  The  patient's allergies, current medications, family history, past medical history, past social history, past surgical history and problem list were reviewed and updated as appropriate.   Previously:   03/11/2019: I am conducting a virtual, video based new patient visit via [email or web address] in lieu of a face-to-face visit for evaluation of his sleep disorder, in particular, evaluation for underlying obstructive sleep apnea. The patient is unaccompanied today and joins [employer] phone from home, I am in my office. He is referred by hisprimary care PA, Inda Coke, and I reviewed her note from 02/01/2019.  His Epworth sleepiness score is 24 out of 24, fatigue score is 15 out of 63.He admits to being sleepy for the past 3 or 4 years. He admits to dozing off while driving and even while at work. He reports no are accidents or problems at work. 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 Bedtime is generally around 8 and rise time between 3:30 and 4. He denies night to night nocturia or recurrent morning headaches recently. He believes his father has sleep apnea but is not sure if he has a CPAP machine. He has woken up with a sense of gasping for air and has been witnessed pauses in his breathing by his fiance. He lives with his fiance and fiancs young daughter. They have no pets in the household. He is a nonsmoker and drinks caffeine occasionally in the form of tea and has cut down on sodas. He drinks alcohol occasionally. He has a TV in the bedroom which tends to stay on all night. His weight has been fluctuating. He is trying to work on weight loss.   REVIEW OF SYSTEMS: Out of a complete 14 system review of symptoms, the patient complains only of the following symptoms, none and all other reviewed systems are negative.  ALLERGIES: No Known Allergies  HOME MEDICATIONS: Outpatient Medications Prior to Visit  Medication Sig Dispense Refill  . amLODipine (NORVASC) 10 MG  tablet TAKE 1 TABLET BY MOUTH EVERY DAY 90 tablet 1  . hydrochlorothiazide (HYDRODIURIL) 25 MG tablet TAKE 1 TABLET BY MOUTH EVERY DAY 90 tablet 1  . chlorhexidine (PERIDEX) 0.12 % solution SMARTSIG:0.5 Ounce(s) By Mouth Twice Daily     No facility-administered medications prior to visit.    PAST MEDICAL HISTORY: Past Medical History:  Diagnosis Date  . Hypertension     PAST SURGICAL HISTORY: No past surgical history on file.  FAMILY HISTORY: Family History  Problem Relation Age of Onset  . Hypertension Mother   . Hypertension Father   . Prostate cancer Neg Hx   . Colon cancer Neg Hx     SOCIAL HISTORY: Social History   Socioeconomic History  . Marital status: Single    Spouse name: Not  on file  . Number of children: 4  . Years of education: Not on file  . Highest education level: Not on file  Occupational History  . Not on file  Tobacco Use  . Smoking status: Never Smoker  . Smokeless tobacco: Never Used  Substance and Sexual Activity  . Alcohol use: No  . Drug use: No  . Sexual activity: Yes    Birth control/protection: Condom  Other Topics Concern  . Not on file  Social History Narrative  . Not on file   Social Determinants of Health   Financial Resource Strain:   . Difficulty of Paying Living Expenses: Not on file  Food Insecurity:   . Worried About Charity fundraiser in the Last Year: Not on file  . Ran Out of Food in the Last Year: Not on file  Transportation Needs:   . Lack of Transportation (Medical): Not on file  . Lack of Transportation (Non-Medical): Not on file  Physical Activity:   . Days of Exercise per Week: Not on file  . Minutes of Exercise per Session: Not on file  Stress:   . Feeling of Stress : Not on file  Social Connections:   . Frequency of Communication with Friends and Family: Not on file  . Frequency of Social Gatherings with Friends and Family: Not on file  . Attends Religious Services: Not on file  . Active Member of  Clubs or Organizations: Not on file  . Attends Archivist Meetings: Not on file  . Marital Status: Not on file  Intimate Partner Violence:   . Fear of Current or Ex-Partner: Not on file  . Emotionally Abused: Not on file  . Physically Abused: Not on file  . Sexually Abused: Not on file      PHYSICAL EXAM  Vitals:   01/03/20 1518  BP: (!) 156/102  Pulse: 86  Temp: (!) 97 F (36.1 C)  Weight: 232 lb 9.6 oz (105.5 kg)  Height: '5\' 8"'$  (1.727 m)   Body mass index is 35.37 kg/m.  Generalized: Well developed, in no acute distress  Cardiology: normal rate and rhythm, no murmur noted Respiratory: clear to auscultation bilaterally  Neurological examination  Mentation: Alert oriented to time, place, history taking. Follows all commands speech and language fluent Cranial nerve II-XII: Pupils were equal round reactive to light. Extraocular movements were full, visual field were full  Motor: The motor testing reveals 5 over 5 strength of all 4 extremities. Good symmetric motor tone is noted throughout.  Gait and station: Gait is normal.    DIAGNOSTIC DATA (LABS, IMAGING, TESTING) - I reviewed patient records, labs, notes, testing and imaging myself where available.  No flowsheet data found.   Lab Results  Component Value Date   WBC 5.6 09/27/2019   HGB 15.2 09/27/2019   HCT 45.6 09/27/2019   MCV 92.9 09/27/2019   PLT 299.0 09/27/2019      Component Value Date/Time   NA 139 09/27/2019 1519   K 4.1 09/27/2019 1519   CL 103 09/27/2019 1519   CO2 30 09/27/2019 1519   GLUCOSE 101 (H) 09/27/2019 1519   BUN 14 09/27/2019 1519   CREATININE 1.19 09/27/2019 1519   CREATININE 1.21 09/03/2019 1549   CALCIUM 9.4 09/27/2019 1519   PROT 7.7 09/27/2019 1519   ALBUMIN 4.5 09/27/2019 1519   AST 14 09/27/2019 1519   ALT 16 09/27/2019 1519   ALKPHOS 63 09/27/2019 1519   BILITOT 0.7 09/27/2019 1519  GFRNONAA >60 01/06/2019 1554   GFRAA >60 01/06/2019 1554   Lab Results    Component Value Date   CHOL 205 (H) 09/27/2019   HDL 38.50 (L) 09/27/2019   LDLCALC 141 (H) 09/27/2019   TRIG 130.0 09/27/2019   CHOLHDL 5 09/27/2019   Lab Results  Component Value Date   HGBA1C 6.1 09/27/2019   No results found for: VITAMINB12 No results found for: TSH     ASSESSMENT AND PLAN 44 y.o. year old male  has a past medical history of Hypertension. here with     ICD-10-CM   1. OSA on CPAP  G47.33 For home use only DME continuous positive airway pressure (CPAP)   Z99.89     [place] admits that he has not been complaint with usage since last being seen. He did not hear back regarding mask refitting and has not called to inquire. He is very motivated to resume therapy as he notes significant improvements in sleep quality and daytime energy levels with therapy. We have discussed insurance requirements for compliance and, more importantly, risks of untreated sleep apnea. We will resend mask refitting orders today. He was also given contact number for DME and advised to call if he has not heard back in 3-4 days. He was encouraged to use CPAP nightly and greater than 4 ours each night. He will follow up with me in 3 months. He will keep a close eye on BP at home and call PCP for persistent elevated readings. He verbalizes understanding and agreement with this plan.    Orders Placed This Encounter  Procedures  . For home use only DME continuous positive airway pressure (CPAP)    Mask refitting please.    Order Specific Question:   Length of Need    Answer:   Lifetime    Order Specific Question:   Patient has OSA or probable OSA    Answer:   Yes    Order Specific Question:   Is the patient currently using CPAP in the home    Answer:   Yes    Order Specific Question:   Settings    Answer:   Other see comments    Order Specific Question:   CPAP supplies needed    Answer:   Mask, headgear, cushions, filters, heated tubing and water chamber     No orders of the defined  types were placed in this encounter.     I spent 15 minutes with the patient. 50% of this time was spent counseling and educating patient on plan of care and medications.    Debbora Presto, FNP-C 01/04/2020, 9:56 AM [facility] [address] [place], [place] [address] [phone]  I reviewed the above note and documentation by the Nurse Practitioner and agree with the history, exam, assessment and plan as outlined above. I was available for consultation. Star Age, MD, PhD [facility])

## 2020-01-04 ENCOUNTER — Encounter: Payer: Self-pay | Admitting: Family Medicine

## 2020-04-03 ENCOUNTER — Ambulatory Visit: Payer: PRIVATE HEALTH INSURANCE | Admitting: Family Medicine

## 2020-04-03 ENCOUNTER — Encounter: Payer: Self-pay | Admitting: Family Medicine

## 2020-04-03 NOTE — Progress Notes (Deleted)
PATIENT: Nathaniel Daugherty DOB: November 12, 1975  REASON FOR VISIT: follow up HISTORY FROM: patient  No chief complaint on file.    HISTORY OF PRESENT ILLNESS: Today 04/03/20 Nathaniel Daugherty is a 44 y.o. male here today for follow up for OSA on CPAP.  We sent him for a mask refitting at his last visit with me in February.  He has requested to try nasal pillows as he felt that the full facemask was uncomfortable.  Compliance report dated 02/22/2020 through 03/22/2020 reveals that he used CPAP 20 of the past 30 days for compliance of 67%.  He is CPAP greater than 4 hours 14 of the past 30 days for compliance of 47%.  Average usage on days used is 4 hours and 18 minutes.  Residual AHI was 0.7 on 7 to 14 cm of water and an EPR of 3.  There was no significant leak noted.  HISTORY: (copied from my note on 01/03/2020)  Nathaniel Daugherty is a 44 y.o. male here today for follow up of OSA on CPAP. He was started on auto PAP over the summer. He was seen for initial compliance review and was doing fairly well. He requested to try a nasal pillow and wished to continue working on getting more sleep and losing weight. He did not hear back from anyone regarding replacing mask. He admits that he has not been as compliant with usage due to feeling that FFM is uncomfortable. He wishes to resume therapy consistently as he notes significant improvement in sleep quality and daytime energy levels. He is requesting to reorder mask fitting.   Compliance report dated 11/05/2019 through 01/03/2020 reveals that of the last 60 days he used CPAP 31 days for compliance of 52%.  He used CPAP 14 of the last 60 days for greater than 4 hours for compliance of 23%.  Average usage was 3 hours and 48 minutes.  Residual AHI was 0.9 on 7 to 14 cm of water and an EPR of 3.  There was no significant leak noted.  He does note that BP has been elevated today. He usually takes HCTZ 91m and amlodipine 122min the mornings. He forgot his  medication this morning. He does endorse a slight headache today. No chest pain, shob, numbness, tingling or dizziness.   HISTORY: (copied from Dr AtGuadelupe Sabinote on 07/01/2019)  Nathaniel Daugherty a [age]ear old right-handed man with an underlying medical history of hypertension and obesity, who presents for follow-up consultation of his obstructive sleep apnea, after baseline sleep study and starting AutoPap therapy at home. The patient is unaccompanied today. I first met him on 03/11/2019 in virtual visit at the request of his primary care PA, at which time he reported snoring and significant daytime somnolence. His Epworth sleepiness score was 24 at the time. He was advised to proceed with sleep study testing. He had a baseline sleep study on 04/25/2019. I went over his test results with him. His sleep efficiency was 93%, sleep latency 2.5 minutes, REM latency slightly reduced at 50.5 minutes. He had a increased percentage of stage II sleep, and mildly increased percentage of REM sleep at 27.3%. His AHI was in the moderate range at 23.1/h, supine AHI was in the severe range at 72.7/h, REM AHI also in the severe range at 50.3/h, average oxygen saturation was 94%, nadir was 83%. He had no significant PLM's. Given the COVID-19 pandemic and the sleep lab not conducting any CPAP titration studies at the time, he was  advised to proceed with AutoPap therapy at home.   Today, 07/01/2019: I reviewed his AutoPap compliance data from 05/31/2019 through 06/29/2019 which is a total of 30 days, during which time he used his machine every night with percent used days greater than 4 hours at 90%, indicating excellent compliance but average usage of 4 hours and 34 minutes, residual AHI 0.8/h, leak acceptable with a 95th percentile at 11.8 L/min, 95th percentile of pressure at 10.7 cm with a range of 7 cm to 14 cm with EPR. He reports that he is still trying to get used to using his AutoPap. He reports That he  has noticed some improvements already including no more snoring, less daytime somnolence. He would like to try a nasal pillows interface. He has been using a nasal mask and it bothers him sometimes. He is trying to lose weight. He is motivated to continue with treatment. He admits that he does not always get enough sleep. He would like to be in bed between 830 and 9, he has to get up at 4, often it will be 11 until he goes to bed. He works as a Freight forwarder. ESS is 20.  The patient's allergies, current medications, family history, past medical history, past social history, past surgical history and problem list were reviewed and updated as appropriate.  Previously:   03/11/2019: I am conducting a virtual, video based new patient visit via [email or web address] in lieu of a face-to-face visit for evaluation of his sleep disorder, in particular, evaluation for underlying obstructive sleep apnea. The patient is unaccompanied today and joins [employer] phone from home, I am in my office. He is referred by hisprimary care PA, Nathaniel Daugherty, and I reviewed her note from 02/01/2019.  His Epworth sleepiness score is 24 out of 24, fatigue score is 15 out of 63.He admits to being sleepy for the past 3 or 4 years. He admits to dozing off while driving and even while at work. He reports no are accidents or problems at work. 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 Bedtime is generally around 8 and rise time between 3:30 and 4. He denies night to night nocturia or recurrent morning headaches recently. He believes his father has sleep apnea but is not sure if he has a CPAP machine. He has woken up with a sense of gasping for air and has been witnessed pauses in his breathing by his fiance. He lives with his fiance and fiancs young daughter. They have no pets in the household. He is a nonsmoker and drinks caffeine occasionally in the form of tea and has cut down on sodas. He  drinks alcohol occasionally. He has a TV in the bedroom which tends to stay on all night. His weight has been fluctuating. He is trying to work on weight loss.    REVIEW OF SYSTEMS: Out of a complete 14 system review of symptoms, the patient complains only of the following symptoms, and all other reviewed systems are negative.  ALLERGIES: No Known Allergies  HOME MEDICATIONS: Outpatient Medications Prior to Visit  Medication Sig Dispense Refill  . amLODipine (NORVASC) 10 MG tablet TAKE 1 TABLET BY MOUTH EVERY DAY 90 tablet 1  . hydrochlorothiazide (HYDRODIURIL) 25 MG tablet TAKE 1 TABLET BY MOUTH EVERY DAY 90 tablet 1   No facility-administered medications prior to visit.    PAST MEDICAL HISTORY: Past Medical History:  Diagnosis Date  . Hypertension  PAST SURGICAL HISTORY: No past surgical history on file.  FAMILY HISTORY: Family History  Problem Relation Age of Onset  . Hypertension Mother   . Hypertension Father   . Prostate cancer Neg Hx   . Colon cancer Neg Hx     SOCIAL HISTORY: Social History   Socioeconomic History  . Marital status: Single    Spouse name: Not on file  . Number of children: 4  . Years of education: Not on file  . Highest education level: Not on file  Occupational History  . Not on file  Tobacco Use  . Smoking status: Never Smoker  . Smokeless tobacco: Never Used  Substance and Sexual Activity  . Alcohol use: No  . Drug use: No  . Sexual activity: Yes    Birth control/protection: Condom  Other Topics Concern  . Not on file  Social History Narrative  . Not on file   Social Determinants of Health   Financial Resource Strain:   . Difficulty of Paying Living Expenses:   Food Insecurity:   . Worried About Charity fundraiser in the Last Year:   . Arboriculturist in the Last Year:   Transportation Needs:   . Film/video editor (Medical):   Marland Kitchen Lack of Transportation (Non-Medical):   Physical Activity:   . Days of Exercise  per Week:   . Minutes of Exercise per Session:   Stress:   . Feeling of Stress :   Social Connections:   . Frequency of Communication with Friends and Family:   . Frequency of Social Gatherings with Friends and Family:   . Attends Religious Services:   . Active Member of Clubs or Organizations:   . Attends Archivist Meetings:   Marland Kitchen Marital Status:   Intimate Partner Violence:   . Fear of Current or Ex-Partner:   . Emotionally Abused:   Marland Kitchen Physically Abused:   . Sexually Abused:       PHYSICAL EXAM  There were no vitals filed for this visit. There is no height or weight on file to calculate BMI.  Generalized: Well developed, in no acute distress  Cardiology: normal rate and rhythm, no murmur noted Respiratory: clear to auscultation bilaterally  Neurological examination  Mentation: Alert oriented to time, place, history taking. Follows all commands speech and language fluent Cranial nerve II-XII: Pupils were equal round reactive to light. Extraocular movements were full, visual field were full on confrontational test. Facial sensation and strength were normal. Uvula tongue midline. Head turning and shoulder shrug  were normal and symmetric. Motor: The motor testing reveals 5 over 5 strength of all 4 extremities. Good symmetric motor tone is noted throughout.  Sensory: Sensory testing is intact to soft touch on all 4 extremities. No evidence of extinction is noted.  Coordination: Cerebellar testing reveals good finger-nose-finger and heel-to-shin bilaterally.  Gait and station: Gait is normal. Tandem gait is normal. Romberg is negative. No drift is seen.  Reflexes: Deep tendon reflexes are symmetric and normal bilaterally.   DIAGNOSTIC DATA (LABS, IMAGING, TESTING) - I reviewed patient records, labs, notes, testing and imaging myself where available.  No flowsheet data found.   Lab Results  Component Value Date   WBC 5.6 09/27/2019   HGB 15.2 09/27/2019   HCT 45.6  09/27/2019   MCV 92.9 09/27/2019   PLT 299.0 09/27/2019      Component Value Date/Time   NA 139 09/27/2019 1519   K 4.1 09/27/2019 1519  CL 103 09/27/2019 1519   CO2 30 09/27/2019 1519   GLUCOSE 101 (H) 09/27/2019 1519   BUN 14 09/27/2019 1519   CREATININE 1.19 09/27/2019 1519   CREATININE 1.21 09/03/2019 1549   CALCIUM 9.4 09/27/2019 1519   PROT 7.7 09/27/2019 1519   ALBUMIN 4.5 09/27/2019 1519   AST 14 09/27/2019 1519   ALT 16 09/27/2019 1519   ALKPHOS 63 09/27/2019 1519   BILITOT 0.7 09/27/2019 1519   GFRNONAA >60 01/06/2019 1554   GFRAA >60 01/06/2019 1554   Lab Results  Component Value Date   CHOL 205 (H) 09/27/2019   HDL 38.50 (L) 09/27/2019   LDLCALC 141 (H) 09/27/2019   TRIG 130.0 09/27/2019   CHOLHDL 5 09/27/2019   Lab Results  Component Value Date   HGBA1C 6.1 09/27/2019   No results found for: VITAMINB12 No results found for: TSH     ASSESSMENT AND PLAN 44 y.o. year old male  has a past medical history of Hypertension. here with ***  No diagnosis found.     No orders of the defined types were placed in this encounter.    No orders of the defined types were placed in this encounter.     I spent 15 minutes with the patient. 50% of this time was spent counseling and educating patient on plan of care and medications.    Debbora Presto, FNP-C 04/03/2020, 12:41 PM [facility] [address], [place], [place] [address] [phone]

## 2020-06-14 ENCOUNTER — Encounter (HOSPITAL_COMMUNITY): Payer: Self-pay | Admitting: Emergency Medicine

## 2020-06-14 ENCOUNTER — Inpatient Hospital Stay (HOSPITAL_COMMUNITY)
Admission: EM | Admit: 2020-06-14 | Discharge: 2020-06-23 | DRG: 177 | Disposition: A | Discharge status: Home Health | Payer: PRIVATE HEALTH INSURANCE | Attending: Internal Medicine | Admitting: Internal Medicine

## 2020-06-14 ENCOUNTER — Emergency Department (HOSPITAL_COMMUNITY): Payer: PRIVATE HEALTH INSURANCE

## 2020-06-14 ENCOUNTER — Other Ambulatory Visit: Payer: Self-pay

## 2020-06-14 DIAGNOSIS — R040 Epistaxis: Secondary | ICD-10-CM | POA: Diagnosis not present

## 2020-06-14 DIAGNOSIS — R0602 Shortness of breath: Secondary | ICD-10-CM | POA: Diagnosis present

## 2020-06-14 DIAGNOSIS — J1282 Pneumonia due to coronavirus disease 2019: Secondary | ICD-10-CM | POA: Diagnosis present

## 2020-06-14 DIAGNOSIS — E869 Volume depletion, unspecified: Secondary | ICD-10-CM | POA: Diagnosis not present

## 2020-06-14 DIAGNOSIS — E785 Hyperlipidemia, unspecified: Secondary | ICD-10-CM | POA: Diagnosis present

## 2020-06-14 DIAGNOSIS — E669 Obesity, unspecified: Secondary | ICD-10-CM | POA: Diagnosis not present

## 2020-06-14 DIAGNOSIS — U071 COVID-19: Principal | ICD-10-CM | POA: Diagnosis present

## 2020-06-14 DIAGNOSIS — I248 Other forms of acute ischemic heart disease: Secondary | ICD-10-CM | POA: Diagnosis present

## 2020-06-14 DIAGNOSIS — R7303 Prediabetes: Secondary | ICD-10-CM | POA: Diagnosis present

## 2020-06-14 DIAGNOSIS — J9601 Acute respiratory failure with hypoxia: Secondary | ICD-10-CM | POA: Diagnosis present

## 2020-06-14 DIAGNOSIS — I10 Essential (primary) hypertension: Secondary | ICD-10-CM | POA: Diagnosis present

## 2020-06-14 DIAGNOSIS — G4733 Obstructive sleep apnea (adult) (pediatric): Secondary | ICD-10-CM | POA: Diagnosis present

## 2020-06-14 DIAGNOSIS — Z6835 Body mass index (BMI) 35.0-35.9, adult: Secondary | ICD-10-CM | POA: Diagnosis not present

## 2020-06-14 DIAGNOSIS — Z9989 Dependence on other enabling machines and devices: Secondary | ICD-10-CM | POA: Diagnosis not present

## 2020-06-14 DIAGNOSIS — T380X5A Adverse effect of glucocorticoids and synthetic analogues, initial encounter: Secondary | ICD-10-CM | POA: Diagnosis not present

## 2020-06-14 DIAGNOSIS — E860 Dehydration: Secondary | ICD-10-CM | POA: Diagnosis present

## 2020-06-14 DIAGNOSIS — E871 Hypo-osmolality and hyponatremia: Secondary | ICD-10-CM | POA: Diagnosis present

## 2020-06-14 DIAGNOSIS — N179 Acute kidney failure, unspecified: Secondary | ICD-10-CM | POA: Diagnosis present

## 2020-06-14 DIAGNOSIS — R739 Hyperglycemia, unspecified: Secondary | ICD-10-CM | POA: Diagnosis not present

## 2020-06-14 HISTORY — DX: Obstructive sleep apnea (adult) (pediatric): G47.33

## 2020-06-14 LAB — CBC WITH DIFFERENTIAL/PLATELET
Abs Immature Granulocytes: 0.01 10*3/uL (ref 0.00–0.07)
Basophils Absolute: 0 10*3/uL (ref 0.0–0.1)
Basophils Relative: 0 %
Eosinophils Absolute: 0 10*3/uL (ref 0.0–0.5)
Eosinophils Relative: 0 %
HCT: 54.8 % — ABNORMAL HIGH (ref 39.0–52.0)
Hemoglobin: 18.5 g/dL — ABNORMAL HIGH (ref 13.0–17.0)
Immature Granulocytes: 0 %
Lymphocytes Relative: 32 %
Lymphs Abs: 1.6 10*3/uL (ref 0.7–4.0)
MCH: 29.9 pg (ref 26.0–34.0)
MCHC: 33.8 g/dL (ref 30.0–36.0)
MCV: 88.7 fL (ref 80.0–100.0)
Monocytes Absolute: 0.7 10*3/uL (ref 0.1–1.0)
Monocytes Relative: 13 %
Neutro Abs: 2.7 10*3/uL (ref 1.7–7.7)
Neutrophils Relative %: 55 %
Platelets: 210 10*3/uL (ref 150–400)
RBC: 6.18 MIL/uL — ABNORMAL HIGH (ref 4.22–5.81)
RDW: 11.9 % (ref 11.5–15.5)
WBC: 4.9 10*3/uL (ref 4.0–10.5)
nRBC: 0 % (ref 0.0–0.2)

## 2020-06-14 LAB — PROCALCITONIN: Procalcitonin: 0.37 ng/mL

## 2020-06-14 LAB — COMPREHENSIVE METABOLIC PANEL
ALT: 43 U/L (ref 0–44)
AST: 72 U/L — ABNORMAL HIGH (ref 15–41)
Albumin: 4.1 g/dL (ref 3.5–5.0)
Alkaline Phosphatase: 72 U/L (ref 38–126)
Anion gap: 13 (ref 5–15)
BUN: 23 mg/dL — ABNORMAL HIGH (ref 6–20)
CO2: 26 mmol/L (ref 22–32)
Calcium: 8.3 mg/dL — ABNORMAL LOW (ref 8.9–10.3)
Chloride: 88 mmol/L — ABNORMAL LOW (ref 98–111)
Creatinine, Ser: 1.71 mg/dL — ABNORMAL HIGH (ref 0.61–1.24)
GFR calc Af Amer: 56 mL/min — ABNORMAL LOW (ref >60)
GFR calc non Af Amer: 49 mL/min — ABNORMAL LOW (ref >60)
Glucose, Bld: 133 mg/dL — ABNORMAL HIGH (ref 70–99)
Potassium: 4 mmol/L (ref 3.5–5.1)
Sodium: 127 mmol/L — ABNORMAL LOW (ref 135–145)
Total Bilirubin: 1.1 mg/dL (ref 0.3–1.2)
Total Protein: 8.4 g/dL — ABNORMAL HIGH (ref 6.5–8.1)

## 2020-06-14 LAB — CK: Total CK: 1189 U/L — ABNORMAL HIGH (ref 49–397)

## 2020-06-14 LAB — TROPONIN I (HIGH SENSITIVITY)
Troponin I (High Sensitivity): 42 ng/L — ABNORMAL HIGH (ref <18)
Troponin I (High Sensitivity): 43 ng/L — ABNORMAL HIGH (ref <18)

## 2020-06-14 LAB — LIPASE, BLOOD: Lipase: 58 U/L — ABNORMAL HIGH (ref 11–51)

## 2020-06-14 LAB — D-DIMER, QUANTITATIVE: D-Dimer, Quant: 0.79 ug/mL-FEU — ABNORMAL HIGH (ref 0.00–0.50)

## 2020-06-14 LAB — FERRITIN: Ferritin: 1644 ng/mL — ABNORMAL HIGH (ref 24–336)

## 2020-06-14 LAB — LACTATE DEHYDROGENASE: LDH: 394 U/L — ABNORMAL HIGH (ref 98–192)

## 2020-06-14 LAB — SARS CORONAVIRUS 2 BY RT PCR (HOSPITAL ORDER, PERFORMED IN ~~LOC~~ HOSPITAL LAB): SARS Coronavirus 2: POSITIVE — AB

## 2020-06-14 LAB — C-REACTIVE PROTEIN: CRP: 5 mg/dL — ABNORMAL HIGH (ref <1.0)

## 2020-06-14 LAB — FIBRINOGEN: Fibrinogen: 509 mg/dL — ABNORMAL HIGH (ref 210–475)

## 2020-06-14 MED ORDER — ASCORBIC ACID 500 MG PO TABS
500.0000 mg | ORAL_TABLET | Freq: Every day | ORAL | Status: DC
  Administered 2020-06-14 – 2020-06-23 (×10): 500 mg via ORAL
  Filled 2020-06-14 (×10): qty 1

## 2020-06-14 MED ORDER — AMLODIPINE BESYLATE 10 MG PO TABS
10.0000 mg | ORAL_TABLET | Freq: Every day | ORAL | Status: DC
  Administered 2020-06-14 – 2020-06-23 (×10): 10 mg via ORAL
  Filled 2020-06-14 (×2): qty 1
  Filled 2020-06-14: qty 2
  Filled 2020-06-14 (×6): qty 1
  Filled 2020-06-14: qty 2

## 2020-06-14 MED ORDER — BISACODYL 5 MG PO TBEC: 5.0000 mg | DELAYED_RELEASE_TABLET | Freq: Every day | ORAL | Status: DC | PRN

## 2020-06-14 MED ORDER — SODIUM CHLORIDE 0.9 % IV SOLN: 250.0000 mL | INTRAVENOUS | Status: DC | PRN

## 2020-06-14 MED ORDER — OXYCODONE HCL 5 MG PO TABS
5.0000 mg | ORAL_TABLET | ORAL | Status: DC | PRN
  Administered 2020-06-14: 5 mg via ORAL
  Filled 2020-06-14: qty 1

## 2020-06-14 MED ORDER — GUAIFENESIN-DM 100-10 MG/5ML PO SYRP
10.0000 mL | ORAL_SOLUTION | ORAL | Status: DC | PRN
  Administered 2020-06-16 – 2020-06-19 (×3): 10 mL via ORAL
  Filled 2020-06-14 (×4): qty 10

## 2020-06-14 MED ORDER — ONDANSETRON HCL 4 MG PO TABS: 4.0000 mg | ORAL_TABLET | Freq: Four times a day (QID) | ORAL | Status: DC | PRN

## 2020-06-14 MED ORDER — ACETAMINOPHEN 325 MG PO TABS
650.0000 mg | ORAL_TABLET | Freq: Four times a day (QID) | ORAL | Status: DC | PRN
  Administered 2020-06-16 – 2020-06-18 (×4): 650 mg via ORAL
  Filled 2020-06-14 (×5): qty 2

## 2020-06-14 MED ORDER — SODIUM CHLORIDE 0.9% FLUSH
3.0000 mL | INTRAVENOUS | Status: DC | PRN
  Administered 2020-06-16: 3 mL via INTRAVENOUS

## 2020-06-14 MED ORDER — SODIUM CHLORIDE 0.9 % IV SOLN
100.0000 mg | Freq: Every day | INTRAVENOUS | Status: AC
  Administered 2020-06-15 – 2020-06-18 (×4): 100 mg via INTRAVENOUS
  Filled 2020-06-14 (×4): qty 20

## 2020-06-14 MED ORDER — FLEET ENEMA 7-19 GM/118ML RE ENEM: 1.0000 | ENEMA | Freq: Once | RECTAL | Status: DC | PRN

## 2020-06-14 MED ORDER — ALBUTEROL SULFATE HFA 108 (90 BASE) MCG/ACT IN AERS
2.0000 | INHALATION_SPRAY | RESPIRATORY_TRACT | Status: DC | PRN
  Administered 2020-06-15 (×2): 2 via RESPIRATORY_TRACT
  Filled 2020-06-14: qty 6.7

## 2020-06-14 MED ORDER — POLYETHYLENE GLYCOL 3350 17 G PO PACK: 17.0000 g | PACK | Freq: Every day | ORAL | Status: DC | PRN

## 2020-06-14 MED ORDER — ACETAMINOPHEN 500 MG PO TABS
1000.0000 mg | ORAL_TABLET | Freq: Once | ORAL | Status: AC
  Administered 2020-06-14: 1000 mg via ORAL
  Filled 2020-06-14: qty 2

## 2020-06-14 MED ORDER — METHYLPREDNISOLONE SODIUM SUCC 125 MG IJ SOLR
0.5000 mg/kg | Freq: Two times a day (BID) | INTRAMUSCULAR | Status: DC
  Administered 2020-06-14 – 2020-06-15 (×2): 52.5 mg via INTRAVENOUS
  Filled 2020-06-14 (×2): qty 2

## 2020-06-14 MED ORDER — SODIUM CHLORIDE 0.9% FLUSH
3.0000 mL | Freq: Two times a day (BID) | INTRAVENOUS | Status: DC
  Administered 2020-06-15 – 2020-06-21 (×13): 3 mL via INTRAVENOUS

## 2020-06-14 MED ORDER — LACTATED RINGERS IV BOLUS
1000.0000 mL | Freq: Once | INTRAVENOUS | Status: AC
  Administered 2020-06-14: 1000 mL via INTRAVENOUS

## 2020-06-14 MED ORDER — SODIUM CHLORIDE 0.9 % IV SOLN
200.0000 mg | Freq: Once | INTRAVENOUS | Status: AC
  Administered 2020-06-14: 200 mg via INTRAVENOUS
  Filled 2020-06-14: qty 40

## 2020-06-14 MED ORDER — KETOROLAC TROMETHAMINE 15 MG/ML IJ SOLN
15.0000 mg | Freq: Once | INTRAMUSCULAR | Status: AC
  Administered 2020-06-14: 15 mg via INTRAVENOUS
  Filled 2020-06-14: qty 1

## 2020-06-14 MED ORDER — ZINC SULFATE 220 (50 ZN) MG PO CAPS
220.0000 mg | ORAL_CAPSULE | Freq: Every day | ORAL | Status: DC
  Administered 2020-06-14 – 2020-06-23 (×10): 220 mg via ORAL
  Filled 2020-06-14 (×10): qty 1

## 2020-06-14 MED ORDER — ENOXAPARIN SODIUM 40 MG/0.4ML ~~LOC~~ SOLN
40.0000 mg | SUBCUTANEOUS | Status: DC
  Administered 2020-06-14 – 2020-06-15 (×2): 40 mg via SUBCUTANEOUS
  Filled 2020-06-14 (×3): qty 0.4

## 2020-06-14 MED ORDER — DEXAMETHASONE SODIUM PHOSPHATE 10 MG/ML IJ SOLN: 10.0000 mg | Freq: Once | INTRAMUSCULAR | Status: DC

## 2020-06-14 MED ORDER — HYDROCOD POLST-CPM POLST ER 10-8 MG/5ML PO SUER: 5.0000 mL | Freq: Two times a day (BID) | ORAL | Status: DC | PRN

## 2020-06-14 MED ORDER — ONDANSETRON HCL 4 MG/2ML IJ SOLN: 4.0000 mg | Freq: Four times a day (QID) | INTRAMUSCULAR | Status: DC | PRN

## 2020-06-14 NOTE — ED Provider Notes (Signed)
[facility] EMERGENCY DEPARTMENT Provider Note   CSN: [id number] Arrival date & time: 06/14/20  1219     History Chief Complaint  Patient presents with  . Shortness of Breath    Nathaniel Daugherty is a 44 y.o. male.   Shortness of Breath Severity:  Moderate Onset quality:  Gradual Duration:  7 days Timing:  Constant Progression:  Worsening Chronicity:  New Context: URI (reportedly covid positive)   Relieved by:  Nothing Worsened by:  Nothing Ineffective treatments:  None tried Associated symptoms: chest pain, cough, fever, headaches and sputum production   Associated symptoms: no rash and no vomiting        Past Medical History:  Diagnosis Date  . Hypertension     Patient Active Problem List   Diagnosis Date Noted  . Insulin resistance 09/29/2019  . Hyperlipidemia 09/29/2019  . Obesity 01/18/2019  . Essential hypertension 01/18/2019    History reviewed. No pertinent surgical history.     Family History  Problem Relation Age of Onset  . Hypertension Mother   . Hypertension Father   . Prostate cancer Neg Hx   . Colon cancer Neg Hx     Social History   Tobacco Use  . Smoking status: Never Smoker  . Smokeless tobacco: Never Used  Vaping Use  . Vaping Use: Never used  Substance Use Topics  . Alcohol use: No  . Drug use: No    Home Medications Prior to Admission medications   Medication Sig Start Date End Date Taking? Authorizing Provider  amLODipine (NORVASC) 10 MG tablet TAKE 1 TABLET BY MOUTH EVERY DAY 11/01/19   Jarold Motto, PA  hydrochlorothiazide (HYDRODIURIL) 25 MG tablet TAKE 1 TABLET BY MOUTH EVERY DAY 11/01/19   Jarold Motto, PA    Allergies    Patient has no known allergies.  Review of Systems   Review of Systems  Constitutional: Positive for activity change, appetite change, chills, fatigue and fever.  HENT: Positive for congestion and rhinorrhea.   Respiratory: Positive for cough, sputum production,  chest tightness and shortness of breath.   Cardiovascular: Positive for chest pain. Negative for palpitations and leg swelling.  Gastrointestinal: Positive for diarrhea. Negative for nausea and vomiting.  Genitourinary: Negative for difficulty urinating and dysuria.  Musculoskeletal: Negative for arthralgias and back pain.  Skin: Negative for color change and rash.  Neurological: Positive for headaches. Negative for light-headedness.    Physical Exam Updated Vital Signs BP (!) 134/99   Pulse (!) 109   Temp 98.2 F (36.8 C) (Oral)   Resp (!) 21   Ht 5\' 8"  (1.727 m)   Wt 105.5 kg   SpO2 93%   BMI 35.36 kg/m   Physical Exam Vitals and nursing note reviewed.  Constitutional:      General: He is not in acute distress.    Appearance: Normal appearance.  HENT:     Head: Normocephalic and atraumatic.     Nose: No rhinorrhea.  Eyes:     General:        Right eye: No discharge.        Left eye: No discharge.     Conjunctiva/sclera: Conjunctivae normal.  Cardiovascular:     Rate and Rhythm: Normal rate and regular rhythm.  Pulmonary:     Effort: Pulmonary effort is normal. No tachypnea.     Breath sounds: No stridor. Rhonchi (throughout) present. No decreased breath sounds.  Abdominal:     General: Abdomen is flat. There is no  distension.     Palpations: Abdomen is soft.     Tenderness: There is no abdominal tenderness.  Musculoskeletal:        General: No deformity or signs of injury.  Skin:    General: Skin is warm and dry.  Neurological:     General: No focal deficit present.     Mental Status: He is alert. Mental status is at baseline.     Motor: No weakness.  Psychiatric:        Mood and Affect: Mood normal.        Behavior: Behavior normal.        Thought Content: Thought content normal.     ED Results / Procedures / Treatments   Labs (all labs ordered are listed, but only abnormal results are displayed) Labs Reviewed  CBC WITH DIFFERENTIAL/PLATELET -  Abnormal; Notable for the following components:      Result Value   RBC 6.18 (*)    Hemoglobin 18.5 (*)    HCT 54.8 (*)    All other components within normal limits  SARS CORONAVIRUS 2 BY RT PCR (HOSPITAL ORDER, PERFORMED IN [place] [facility])  COMPREHENSIVE METABOLIC PANEL  D-DIMER, QUANTITATIVE (NOT AT [facility])  CK  LIPASE, BLOOD  TROPONIN I (HIGH SENSITIVITY)  TROPONIN I (HIGH SENSITIVITY)    EKG EKG Interpretation  Date/Time:  Wednesday June 14 2020 13:27:37 EDT Ventricular Rate:  133 PR Interval:  116 QRS Duration: 78 QT Interval:  322 QTC Calculation: 479 R Axis:   49 Text Interpretation: Sinus tachycardia with Premature supraventricular complexes Right atrial enlargement Borderline ECG Confirmed by Cherlynn Perches ([id number]) on 06/14/2020 1:50:23 PM   Radiology DG Chest Portable 1 View  Result Date: 06/14/2020 CLINICAL DATA:  Pt tested positive for Covid on Sunday, having increase SOB, fever and generalized body aches EXAM: PORTABLE CHEST 1 VIEW COMPARISON:  None. FINDINGS: The heart size and mediastinal contours are within normal limits. Low lung volumes. There are diffuse bilateral infiltrates. No pneumothorax or large pleural effusion. No acute finding in the visualized skeleton. IMPRESSION: Diffuse bilateral infiltrates concerning for multifocal pneumonia. Electronically Signed   By: Emmaline Kluver M.D.   On: 06/14/2020 14:10    Procedures Procedures (including critical care time)  Medications Ordered in ED Medications  dexamethasone (DECADRON) injection 10 mg (has no administration in time range)  acetaminophen (TYLENOL) tablet 1,000 mg (1,000 mg Oral Given 06/14/20 1322)  lactated ringers bolus 1,000 mL (1,000 mLs Intravenous New Bag/Given 06/14/20 1532)  ketorolac (TORADOL) 15 MG/ML injection 15 mg (15 mg Intravenous Given 06/14/20 1530)    ED Course  I have reviewed the triage vital signs and the nursing notes.  Pertinent labs & imaging results that were  available during my care of the patient were reviewed by me and considered in my medical decision making (see chart for details).    MDM Rules/Calculators/A&P                          Told he was Covid positive on Sunday.  Has had URI type symptoms for 1 week.  Worsening shortness of breath chest pain, diarrhea myalgias.  Hemodynamically here stable, mild tachypnea triage but when I am in the room is breathing comfortably.  Diffuse rhonchi throughout.  We will get chest x-ray screening labs IV hydration Toradol.  EKG was done in triage shows sinus tachycardia with no acute ischemic change interval abnormality significant changes.  Chest x-ray reviewed  by myself shows hypoventilated bilateral lungs with scattered opacities, consistent with Covid infection.  Nursing is told me this patient requiring supplemental oxygen, with desaturations into the mid 80s.  He remains tachycardic.  Up to 4 L oxygen.  Concerning findings for Covid on chest x-ray he will be given Decadron remdesivir, he had an outpatient Covid test that was positive but I cannot see, however repeat is pending.  He will likely need admission for further management.  Patient is still awaiting laboratory testing results, he is given Decadron and remdesivir based on chest x-ray findings oxygenation needs here and reported Covid positive test as an outpatient.  Still waiting for other screening lab studies to come back however he will require admission.  The patient will be admitted to the hospitalist.  For the remainder this patient's care please see inpatient team notes.  I will intervene as needed while the patient remains in the emergency department.   Final Clinical Impression(s) / ED Diagnoses Final diagnoses:  COVID-19  SOB (shortness of breath)  Acute respiratory failure with hypoxia [facility])    Rx / DC Orders ED Discharge Orders    None       Sabino Donovan, MD 06/14/20 1609

## 2020-06-14 NOTE — ED Notes (Signed)
This RN paged Pharmacy to send missing medication or to contact me to come pick up medication.

## 2020-06-14 NOTE — ED Triage Notes (Signed)
Pt tested positive for Covid on Sunday, having increase SOB, fever and generalized body aches.

## 2020-06-14 NOTE — ED Notes (Signed)
O2 increased to 4 lpm, notified EDP.

## 2020-06-14 NOTE — ED Notes (Signed)
2 golds, Lav, dr green sent to lab with blue and lt green

## 2020-06-14 NOTE — H&P (Signed)
History and Physical    Nathaniel Daugherty WFU:[id number] DOB: 1975/12/01 DOA: 06/14/2020  PCP: Jarold Motto, PA Consultants:  Frances Furbish - neurology Patient coming from:  Home - lives with significant other; NOK: Significant other, Harriette Bouillon, [phone]  Chief Complaint:  Worsening COVID symptoms  HPI: Nathaniel Daugherty is a 44 y.o. male with medical history significant of HTN; OSA; and obesity (BMI 35) presenting with worsening COVID symptoms.  He returned home from work on Friday with generalized fatigue and diarrhea.  He developed SOB which has been progressive.  He went Sunday for a test and was COVID +.  His significant other is now also showing symptoms.   ED Course:  COVID test + Sunday, significant other also has.  7 days of symptoms, worsening SOB, CXR c/w COVID.  Given Decadron and Remdesivir.  Review of Systems: As per HPI; otherwise review of systems reviewed and negative.   Ambulatory Status:  Ambulates without assistance  COVID Vaccine Status:  None  Past Medical History:  Diagnosis Date  . Hypertension   . OSA on CPAP     History reviewed. No pertinent surgical history.  Social History   Socioeconomic History  . Marital status: Single    Spouse name: Not on file  . Number of children: 4  . Years of education: Not on file  . Highest education level: Not on file  Occupational History  . Occupation: Estate agent  Tobacco Use  . Smoking status: Never Smoker  . Smokeless tobacco: Never Used  Vaping Use  . Vaping Use: Never used  Substance and Sexual Activity  . Alcohol use: No  . Drug use: No  . Sexual activity: Yes    Birth control/protection: Condom  Other Topics Concern  . Not on file  Social History Narrative  . Not on file   Social Determinants of Health   Financial Resource Strain:   . Difficulty of Paying Living Expenses:   Food Insecurity:   . Worried About Programme researcher, broadcasting/film/video in the Last Year:   . Barista in the Last  Year:   Transportation Needs:   . Freight forwarder (Medical):   Marland Kitchen Lack of Transportation (Non-Medical):   Physical Activity:   . Days of Exercise per Week:   . Minutes of Exercise per Session:   Stress:   . Feeling of Stress :   Social Connections:   . Frequency of Communication with Friends and Family:   . Frequency of Social Gatherings with Friends and Family:   . Attends Religious Services:   . Active Member of Clubs or Organizations:   . Attends Banker Meetings:   Marland Kitchen Marital Status:   Intimate Partner Violence:   . Fear of Current or Ex-Partner:   . Emotionally Abused:   Marland Kitchen Physically Abused:   . Sexually Abused:     No Known Allergies  Family History  Problem Relation Age of Onset  . Hypertension Mother   . Hypertension Father   . Prostate cancer Neg Hx   . Colon cancer Neg Hx     Prior to Admission medications   Medication Sig Start Date End Date Taking? Authorizing Provider  amLODipine (NORVASC) 10 MG tablet TAKE 1 TABLET BY MOUTH EVERY DAY 11/01/19   Jarold Motto, PA  hydrochlorothiazide (HYDRODIURIL) 25 MG tablet TAKE 1 TABLET BY MOUTH EVERY DAY 11/01/19   Jarold Motto, [name]    Physical Exam: Vitals:   06/14/20 1510 06/14/20 1539 06/14/20 1545 06/14/20  1721  BP: (!) 130/95  (!) 134/99 (!) 129/95  Pulse: (!) 116  (!) 109 100  Resp:    18  Temp:  98.2 F (36.8 C)  98.3 F (36.8 C)  TempSrc:  Oral  Oral  SpO2: (!) 83%  93% 92%  Weight:      Height:         . General:  Appears calm and comfortable and is NAD . Eyes:  PERRL, EOMI, normal lids, iris . ENT:  grossly normal hearing, lips & tongue, mmm; appropriate dentition . Neck:  no LAD, masses or thyromegaly . Cardiovascular:  RR with mild tachycardia, no m/r/g. No LE edema.  Marland Kitchen Respiratory:   Scattered diffuse rhonchi.  Mildly increased respiratory effort, 88% but improved to low 90s with [place] adjustment. . Abdomen:  soft, NT, ND, NABS . Back:   normal alignment, no  CVAT . Skin:  no rash or induration seen on limited exam . Musculoskeletal:  grossly normal tone BUE/BLE, good ROM, no bony abnormality . Psychiatric:  grossly normal mood and affect, speech fluent and appropriate, AOx3 . Neurologic:  CN 2-12 grossly intact, moves all extremities in coordinated fashion    Radiological Exams on Admission: DG Chest Portable 1 View  Result Date: 06/14/2020 CLINICAL DATA:  Pt tested positive for Covid on Sunday, having increase SOB, fever and generalized body aches EXAM: PORTABLE CHEST 1 VIEW COMPARISON:  None. FINDINGS: The heart size and mediastinal contours are within normal limits. Low lung volumes. There are diffuse bilateral infiltrates. No pneumothorax or large pleural effusion. No acute finding in the visualized skeleton. IMPRESSION: Diffuse bilateral infiltrates concerning for multifocal pneumonia. Electronically Signed   By: Emmaline Kluver M.D.   On: 06/14/2020 14:10    EKG: Independently reviewed.  Sinus tachycardia with rate 113; nonspecific ST changes with no evidence of acute ischemia   Labs on Admission: I have personally reviewed the available labs and imaging studies at the time of the admission.  Pertinent labs:   CMP pending WBC 4.9 Hgb 18.5 CK 1189 HS troponin 42, 43 LDH 394 Procalcitonin 0.37 D-dimer 0.79 COVID POSITIVE   Assessment/Plan Principal Problem:   Acute hypoxemic respiratory failure due to COVID-19 [facility]) Active Problems:   Obesity   Essential hypertension   Hyperlipidemia   OSA on CPAP   Acute respiratory failure with hypoxia -Patient with presenting with SOB and diarrhea at home in the setting of known COVID-19 infection -He does not have a usual home O2 requirement and is currently requiring 2L [place] O2 -COVID POSITIVE -The patient has comorbidities which may increase the risk for ARDS/MODS including:  HTN, obesity -Pertinent labs concerning for COVID include normal WBC count; increased BUN/Creatinine;  increased LFTs; increased LDH; elevated D-dimer (not >1); low procalcitonin -CXR with multifocal opacities which may be c/w COVID vs. Multifocal PNA -Will not treat with broad-spectrum antibiotics given procalcitonin <0.5 -Will admit for further evaluation, close monitoring, and treatment -Monitor on telemetry x at least 24 hours -At this time, will attempt to avoid use of aerosolized medications and use HFAs instead -Will check daily labs including BMP with Mag, Phos; LFTs; CBC with differential; CRP; ferritin; fibrinogen; D-dimer -Elevated troponin is likely associated with demand ischemia; will not plan to evaluate further at this time -Will order steroids and Remdesivir (pharmacy consult) given +COVID test, +CXR, and hypoxia <94% on room air -If the patient shows clinical deterioration, consider transfer to ICU with PCCM consultation -Consider Tocilizumab and/or convalescent plasma if the patient  does not stabilize on current treatment or if the patient has marked clinical decompensation; the patient does not appear to require these treatments at this time. -Will attempt to maintain euvolemia to a net negative fluid status -Will ask the patient to maintain an awake prone position for 16+ hours a day, if possible, with a minimum of 2-3 hours at a time -With D-dimer <5, will use standard-dosed Lovenox for DVT prevention -Patient was seen wearing full PPE including: gown, gloves, head cover, N95, and face shield; donning and doffing was in compliance with current standards.  HTN -Continue Norvasc -Hold HCTZ for now  HLD -He does not appear to be taking medications for this issue at this time  OSA -Cannot use CPAP with COVID infection due to increased risk of aerosolization of particles  Obesity -Body mass index is 35.36 kg/m. -Weight loss should be encouraged -Outpatient PCP/bariatric medicine f/u encouraged     DVT prophylaxis:  Lovenox  Code Status:  Full - confirmed with  patient Family Communication: None present; I spoke with the patient's girlfriend by telephone - she is also showing symptoms and is going for testing tonight so outpatient monoclonal Ab treatment was discussed Disposition Plan:  The patient is from: home  Anticipated d/c is to: home without [facility] services once his respiratory issues have been resolved.  He may require home O2 at the time of discharge.  Anticipated d/c date will depend on clinical response to treatment, likely between 3 days (with completion of outpatient Remdesivir treatment) and 5 days  Patient is currently: acutely ill Consults called: None  Admission status: Admit - It is my clinical opinion that admission to INPATIENT is reasonable and necessary because of the expectation that this patient will require hospital care that crosses at least 2 midnights to treat this condition based on the medical complexity of the problems presented.  Given the aforementioned information, the predictability of an adverse outcome is felt to be significant.      Jonah Blue MD [facility]   How to contact the [facility] Attending or Consulting provider 7A - 7P or covering provider during after hours 7P -7A, for this patient?  1. Check the care team in [facility] and look for a) attending/consulting TR[facility] provider listed and b) the [facility] team listed 2. Log into [email or web address] and use [place]'s universal password to access. If you do not have the password, please contact the hospital operator. 3. Locate the [facility] provider you are looking for under [facility] and page to a number that you can be directly reached. 4. If you still have difficulty reaching the provider, please page the [facility] (Director on Call) for the Hospitalists listed on amion for assistance.   06/14/2020, 6:36 PM

## 2020-06-15 DIAGNOSIS — E669 Obesity, unspecified: Secondary | ICD-10-CM

## 2020-06-15 DIAGNOSIS — I10 Essential (primary) hypertension: Secondary | ICD-10-CM

## 2020-06-15 DIAGNOSIS — Z9989 Dependence on other enabling machines and devices: Secondary | ICD-10-CM

## 2020-06-15 DIAGNOSIS — G4733 Obstructive sleep apnea (adult) (pediatric): Secondary | ICD-10-CM

## 2020-06-15 LAB — COMPREHENSIVE METABOLIC PANEL
ALT: 32 U/L (ref 0–44)
AST: 58 U/L — ABNORMAL HIGH (ref 15–41)
Albumin: 3 g/dL — ABNORMAL LOW (ref 3.5–5.0)
Alkaline Phosphatase: 67 U/L (ref 38–126)
Anion gap: 12 (ref 5–15)
BUN: 25 mg/dL — ABNORMAL HIGH (ref 6–20)
CO2: 25 mmol/L (ref 22–32)
Calcium: 8.2 mg/dL — ABNORMAL LOW (ref 8.9–10.3)
Chloride: 89 mmol/L — ABNORMAL LOW (ref 98–111)
Creatinine, Ser: 1.58 mg/dL — ABNORMAL HIGH (ref 0.61–1.24)
GFR calc Af Amer: >60 mL/min (ref >60)
GFR calc non Af Amer: 54 mL/min — ABNORMAL LOW (ref >60)
Glucose, Bld: 238 mg/dL — ABNORMAL HIGH (ref 70–99)
Potassium: 4.7 mmol/L (ref 3.5–5.1)
Sodium: 126 mmol/L — ABNORMAL LOW (ref 135–145)
Total Bilirubin: 1 mg/dL (ref 0.3–1.2)
Total Protein: 6.5 g/dL (ref 6.5–8.1)

## 2020-06-15 LAB — CBC WITH DIFFERENTIAL/PLATELET
Abs Immature Granulocytes: 0 10*3/uL (ref 0.00–0.07)
Basophils Absolute: 0 10*3/uL (ref 0.0–0.1)
Basophils Relative: 0 %
Eosinophils Absolute: 0 10*3/uL (ref 0.0–0.5)
Eosinophils Relative: 0 %
HCT: 49.8 % (ref 39.0–52.0)
Hemoglobin: 17.1 g/dL — ABNORMAL HIGH (ref 13.0–17.0)
Lymphocytes Relative: 21 %
Lymphs Abs: 0.6 10*3/uL — ABNORMAL LOW (ref 0.7–4.0)
MCH: 30.6 pg (ref 26.0–34.0)
MCHC: 34.3 g/dL (ref 30.0–36.0)
MCV: 89.2 fL (ref 80.0–100.0)
Monocytes Absolute: 0.2 10*3/uL (ref 0.1–1.0)
Monocytes Relative: 9 %
Neutro Abs: 1.9 10*3/uL (ref 1.7–7.7)
Neutrophils Relative %: 70 %
Platelets: 201 10*3/uL (ref 150–400)
RBC: 5.58 MIL/uL (ref 4.22–5.81)
RDW: 12 % (ref 11.5–15.5)
WBC: 2.7 10*3/uL — ABNORMAL LOW (ref 4.0–10.5)
nRBC: 0 % (ref 0.0–0.2)
nRBC: 1 /100 WBC — ABNORMAL HIGH

## 2020-06-15 LAB — PHOSPHORUS: Phosphorus: 2.7 mg/dL (ref 2.5–4.6)

## 2020-06-15 LAB — D-DIMER, QUANTITATIVE: D-Dimer, Quant: 0.66 ug/mL-FEU — ABNORMAL HIGH (ref 0.00–0.50)

## 2020-06-15 LAB — FERRITIN: Ferritin: 1219 ng/mL — ABNORMAL HIGH (ref 24–336)

## 2020-06-15 LAB — C-REACTIVE PROTEIN: CRP: 3 mg/dL — ABNORMAL HIGH (ref <1.0)

## 2020-06-15 LAB — MAGNESIUM: Magnesium: 2.1 mg/dL (ref 1.7–2.4)

## 2020-06-15 MED ORDER — TOCILIZUMAB 400 MG/20ML IV SOLN
800.0000 mg | Freq: Once | INTRAVENOUS | Status: AC
  Administered 2020-06-15: 800 mg via INTRAVENOUS
  Filled 2020-06-15: qty 40

## 2020-06-15 MED ORDER — SODIUM CHLORIDE 0.9 % IV SOLN: INTRAVENOUS | Status: DC

## 2020-06-15 MED ORDER — METHYLPREDNISOLONE SODIUM SUCC 125 MG IJ SOLR
60.0000 mg | Freq: Two times a day (BID) | INTRAMUSCULAR | Status: DC
  Administered 2020-06-15 – 2020-06-20 (×10): 60 mg via INTRAVENOUS
  Filled 2020-06-15 (×11): qty 2

## 2020-06-15 MED ORDER — HYDROCOD POLST-CPM POLST ER 10-8 MG/5ML PO SUER
5.0000 mL | Freq: Two times a day (BID) | ORAL | Status: DC
  Administered 2020-06-15 – 2020-06-23 (×17): 5 mL via ORAL
  Filled 2020-06-15 (×17): qty 5

## 2020-06-15 NOTE — Progress Notes (Addendum)
Patient report from ED RN Irving Burton. Patient arrived on floor.  Patient MEWS yellow when he arrived on floor

## 2020-06-15 NOTE — Plan of Care (Signed)

## 2020-06-15 NOTE — Progress Notes (Signed)
   06/15/20 1047  Assess: MEWS Score  Temp 98.3 F (36.8 C)  BP (!) 142/103  Pulse Rate (!) 103  ECG Heart Rate (!) 103  Resp (!) 34  Level of Consciousness Alert  SpO2 91 %  O2 Device Non-rebreather Mask;Nasal Cannula  O2 Flow Rate (L/min) 15 L/min  Assess: MEWS Score  MEWS Temp 0  MEWS Systolic 0  MEWS Pulse 1  MEWS RR 2  MEWS LOC 0  MEWS Score 3  MEWS Score Color Yellow  Treat  MEWS Interventions Administered scheduled meds/treatments  Pain Scale 0-10  Notify: Charge Nurse/RN  Name of Charge Nurse/RN Notified Rodman Pickle  Date Charge Nurse/RN Notified 06/15/20  Time Charge Nurse/RN Notified 1048  Notify: Provider  Provider Name/Title Dr. Ella Jubilee  Date Provider Notified 06/15/20  Time Provider Notified 1048  Notification Type Face-to-face  Notification Reason Other (Comment) (mews yello)  Response No new orders  Date of Provider Response 06/15/20  Time of Provider Response 1049

## 2020-06-15 NOTE — ED Notes (Signed)
Breakfast ordered 

## 2020-06-15 NOTE — Progress Notes (Addendum)
PROGRESS NOTE    Nathaniel Daugherty  [id number]:[id number] DOB: 07-02-1976 DOA: 06/14/2020 PCP: Jarold Motto, PA    Brief Narrative:  Patient admitted to the hospital with a working diagnosis of acute hypoxic respiratory failure due to SARS COVID-19 viral pneumonia.  44 year old male with past medical history of hypertension, obesity class III and obstructive sleep apnea.  Patient tested positive for COVID-19 August 1 his symptoms have been generalized fatigue, diarrhea and rapid and progressive dyspnea.  On his initial physical examination blood pressure 130/95, heart rate 116, respiratory rate 18, temperature 98.2, oxygen saturation 83%.  His lungs had scattered diffuse rhonchi, heart S1-S2, present rhythmic, abdomen soft, no lower extremity edema Sodium 127, potassium 4.0, chloride 88, bicarb 26, glucose 133, BUN 23, creatinine 1.71, AST 72, ALT 43, CK 1,189, white count 4.9, hemoglobin 18.5, hematocrit 54.8, platelets 210.  SARS COVID-19 positive. Chest radiograph with bilateral interstitial infiltrates, right lower lobe, left lower lobe and left upper lobe.  EKG 133 bpm, normal axis, normal intervals, sinus rhythm, poor R wave progression, no significant ST segment or T wave changes, positive LVH.    Assessment & Plan:   Principal Problem:   Acute hypoxemic respiratory failure due to COVID-19 [facility]) Active Problems:   Obesity   Essential hypertension   Hyperlipidemia   OSA on CPAP   1.  Acute hypoxic respiratory failure due to SARS COVID-19 viral pneumonia.  RR:25  Pulse oxymetry: 88% Fi02: 15 L HFNC  COVID-19 Labs  Recent Labs    06/14/20 1542 06/14/20 1555 06/14/20 1626 06/15/20 0439  DDIMER 0.79*  --   --  0.66*  FERRITIN  --  1,644*  --  1,219*  LDH  --   --  394*  --   CRP  --  5.0*  --  3.0*    Lab Results  Component Value Date   SARSCOV2NAA POSITIVE (A) 06/14/2020    Patient with positive fatigue and worsening dyspnea, rapid decline in oxygenation, requiring  increase oxygen supplementation, now on 15 L per HFNC plus non re-breather mask.  Chest film with 3 out of 4 quadrants with interstitial infiltrates.   In the setting of worsening hypoxic respiratory failure, I explained the patient in detail, the use of advanced therapies: Tocilizumab.  I informed him the risks of this interventions including immunosuppression, opportunistic infections, gastrointestinal complications, allergic reactions,  and others.  I explained him the experimental nature of this intervention, in the face of current COVID 19 pandemic, along with the potential benefits of improvement of hypoxic respiratory failure and systemic inflammation. He has agreed to proceed.  Will continue medical therapy with systemic steroids, methylprednisolone 60 mg bid, IV remdesivir and will add one dose of Tocilizumab.   Continue close monitoring of oxymetry and work of breathing. Target oxygen saturation more than 88%. Continue to follow on inflammatory markers.   2. Dehydration, AKI, and impeding rahbdomyolisis. Renal function with serum cr at 1,58, K at 4,7 and serum Na at 126. CPK 1,189.   Will continue hydration with isotonic saline at 75 ml per H for one liter and will follow up renal function and CPK in am. Continue to hold on diuretic therapy, avoid hypotension and nephrotoxic medications.  3. HTN. Blood pressure uncontrolled 142/103, will continue with amlodipine but will hold on HCTZ for now.   4. Leukopenia. Likely reactive to severe viral process, will continue close follow up on cell count in am. Plt are 201 and Hgb is 17,1  Patient continue to be  at high risk for worsening respiratory failure.   Status is: Inpatient  Remains inpatient appropriate because:IV treatments appropriate due to intensity of illness or inability to take PO   Dispo: The patient is from: Home              Anticipated d/c is to: Home              Anticipated d/c date is: > 3 days              Patient  currently is not medically stable to d/c.   DVT prophylaxis: enoxaparin   Code Status:   full  Family Communication:  I left a voicemail to his significant other, unable to reach her at this point   Subjective: Patient continue to have dyspnea, his symptoms rapidly worsening while in the ED and required further supplemental 02 per NRBM. Positive fatigue.   Objective: Vitals:   06/15/20 0500 06/15/20 0700 06/15/20 0715 06/15/20 0803  BP: (!) 140/98 (!) 142/103 (!) 143/105   Pulse: 91 97 95 (!) 102  Resp: (!) 23 (!) 21 (!) 27 (!) 25  Temp:      TempSrc:      SpO2: 91% (!) 89% 91% (!) 88%  Weight:      Height:       No intake or output data in the 24 hours ending 06/15/20 0916 Filed Weights   06/14/20 1315  Weight: 105.5 kg    Examination:   General: deconditioned and ill looking appearing  Neurology: Awake and alert, non focal  E ENT: mild pallor, no icterus, oral mucosa moist Cardiovascular: No JVD. S1-S2 present, rhythmic, no gallops, rubs, or murmurs. No lower extremity edema. Pulmonary: positive breath sounds bilaterally, no wheezing Gastrointestinal. Abdomen soft and non tender Skin. No rashes Musculoskeletal: no joint deformities     Data Reviewed: I have personally reviewed following labs and imaging studies  CBC: Recent Labs  Lab 06/14/20 1540 06/15/20 0439  WBC 4.9 2.7*  NEUTROABS 2.7 1.9  HGB 18.5* 17.1*  HCT 54.8* 49.8  MCV 88.7 89.2  PLT 210 201   Basic Metabolic Panel: Recent Labs  Lab 06/14/20 1540 06/15/20 0439  NA 127* 126*  K 4.0 4.7  CL 88* 89*  CO2 26 25  GLUCOSE 133* 238*  BUN 23* 25*  CREATININE 1.71* 1.58*  CALCIUM 8.3* 8.2*  MG  --  2.1  PHOS  --  2.7   GFR: Estimated Creatinine Clearance: 72.4 mL/min (A) (by C-G formula based on SCr of 1.58 mg/dL (H)). Liver Function Tests: Recent Labs  Lab 06/14/20 1540 06/15/20 0439  AST 72* 58*  ALT 43 32  ALKPHOS 72 67  BILITOT 1.1 1.0  PROT 8.4* 6.5  ALBUMIN 4.1 3.0*    Recent Labs  Lab 06/14/20 1540  LIPASE 58*   No results for input(s): AMMONIA in the last 168 hours. Coagulation Profile: No results for input(s): INR, PROTIME in the last 168 hours. Cardiac Enzymes: Recent Labs  Lab 06/14/20 1540  CKTOTAL 1,189*   BNP (last 3 results) No results for input(s): PROBNP in the last 8760 hours. HbA1C: No results for input(s): HGBA1C in the last 72 hours. CBG: No results for input(s): GLUCAP in the last 168 hours. Lipid Profile: No results for input(s): CHOL, HDL, LDLCALC, TRIG, CHOLHDL, LDLDIRECT in the last 72 hours. Thyroid Function Tests: No results for input(s): TSH, T4TOTAL, FREET4, T3FREE, THYROIDAB in the last 72 hours. Anemia Panel: Recent Labs  06/14/20 1555 06/15/20 0439  FERRITIN 1,644* 1,219*      Radiology Studies: I have reviewed all of the imaging during this hospital visit personally     Scheduled Meds: . amLODipine  10 mg Oral Daily  . vitamin C  500 mg Oral Daily  . enoxaparin (LOVENOX) injection  40 mg Subcutaneous Q24H  . methylPREDNISolone (SOLU-MEDROL) injection  0.5 mg/kg Intravenous Q12H  . sodium chloride flush  3 mL Intravenous Q12H  . zinc sulfate  220 mg Oral Daily   Continuous Infusions: . sodium chloride    . remdesivir 100 mg in NS 100 mL       LOS: 1 day        Racine Erby Annett Gula, MD

## 2020-06-15 NOTE — Progress Notes (Signed)
This note also relates to the following rows which could not be included: BP - Cannot attach notes to unvalidated device data Pulse Rate - Cannot attach notes to unvalidated device data ECG Heart Rate - Cannot attach notes to unvalidated device data Resp - Cannot attach notes to unvalidated device data SpO2 - Cannot attach notes to unvalidated device data    06/15/20 1200  Assess: MEWS Score  Temp 98.3 F (36.8 C)  Level of Consciousness Alert  O2 Device Nasal Cannula;Non-rebreather Mask  O2 Flow Rate (L/min) 15 L/min  Assess: MEWS Score  MEWS Temp 0  MEWS Systolic 0  MEWS Pulse 1  MEWS RR 2  MEWS LOC 0  MEWS Score 3  MEWS Score Color Yellow  Treat  Pain Scale 0-10  Pain Score 0

## 2020-06-15 NOTE — Progress Notes (Signed)
   06/15/20 1617  Vitals  Temp 98.3 F (36.8 C)  Temp Source Axillary  BP (!) 149/103  BP Location Right Arm  BP Method Automatic  Patient Position (if appropriate) Lying  Pulse Rate 98  Pulse Rate Source Monitor  Resp 20  Level of Consciousness  Level of Consciousness Alert  MEWS COLOR  MEWS Score Color Green  Oxygen Therapy  SpO2 90 %  O2 Device Nasal Cannula  O2 Flow Rate (L/min) 15 L/min  Patient Activity (if Appropriate) In bed  Pain Assessment  Pain Scale 0-10  Pain Score 0  MEWS Score  MEWS Temp 0  MEWS Systolic 0  MEWS Pulse 0  MEWS RR 0  MEWS LOC 0  MEWS Score 0

## 2020-06-16 LAB — D-DIMER, QUANTITATIVE: D-Dimer, Quant: 0.39 ug/mL-FEU (ref 0.00–0.50)

## 2020-06-16 LAB — COMPREHENSIVE METABOLIC PANEL
ALT: 30 U/L (ref 0–44)
AST: 39 U/L (ref 15–41)
Albumin: 2.7 g/dL — ABNORMAL LOW (ref 3.5–5.0)
Alkaline Phosphatase: 63 U/L (ref 38–126)
Anion gap: 10 (ref 5–15)
BUN: 20 mg/dL (ref 6–20)
CO2: 27 mmol/L (ref 22–32)
Calcium: 8.1 mg/dL — ABNORMAL LOW (ref 8.9–10.3)
Chloride: 92 mmol/L — ABNORMAL LOW (ref 98–111)
Creatinine, Ser: 1.34 mg/dL — ABNORMAL HIGH (ref 0.61–1.24)
GFR calc Af Amer: >60 mL/min (ref >60)
GFR calc non Af Amer: >60 mL/min (ref >60)
Glucose, Bld: 178 mg/dL — ABNORMAL HIGH (ref 70–99)
Potassium: 4.6 mmol/L (ref 3.5–5.1)
Sodium: 129 mmol/L — ABNORMAL LOW (ref 135–145)
Total Bilirubin: 0.9 mg/dL (ref 0.3–1.2)
Total Protein: 6.1 g/dL — ABNORMAL LOW (ref 6.5–8.1)

## 2020-06-16 LAB — FERRITIN: Ferritin: 1135 ng/mL — ABNORMAL HIGH (ref 24–336)

## 2020-06-16 LAB — C-REACTIVE PROTEIN: CRP: 0.9 mg/dL (ref <1.0)

## 2020-06-16 LAB — CK: Total CK: 282 U/L (ref 49–397)

## 2020-06-16 MED ORDER — FAMOTIDINE 20 MG PO TABS
20.0000 mg | ORAL_TABLET | Freq: Every day | ORAL | Status: DC
  Administered 2020-06-16 – 2020-06-23 (×8): 20 mg via ORAL
  Filled 2020-06-16 (×8): qty 1

## 2020-06-16 MED ORDER — ENOXAPARIN SODIUM 60 MG/0.6ML ~~LOC~~ SOLN
0.5000 mg/kg | SUBCUTANEOUS | Status: DC
  Administered 2020-06-16 – 2020-06-18 (×3): 52.5 mg via SUBCUTANEOUS
  Filled 2020-06-16 (×3): qty 0.6

## 2020-06-16 MED ORDER — PHENOL 1.4 % MT LIQD
1.0000 | OROMUCOSAL | Status: DC | PRN
  Administered 2020-06-16: 1 via OROMUCOSAL
  Filled 2020-06-16: qty 177

## 2020-06-16 NOTE — Progress Notes (Signed)
Patient is alert and oriented x 4 and aware of their surroundings. Patient is complaining of his throat being sore and hard to swallow. Discussed with provider earlier and determined to give Tylenol.  A second dose of tylenol was given.   Patient bed is in the low position and the wheels are locked. Patient has his call bell in place and is aware of how to use. Patient has the television on as well.  Patient has the bed side commode near him but aware to call when needing to use that. Patient has 2 side rails up at this time. Patient has bed side ekg monitor on and in place.  Patient has no other complaints or concerns at this time.

## 2020-06-16 NOTE — Progress Notes (Signed)
Patient sitting upright in no acute distress at this time. Patient has [place] and NRB mask on in place.  Patient does complain of sore throat and some throat spray has been ordered for him.

## 2020-06-16 NOTE — Progress Notes (Addendum)
PROGRESS NOTE    Nathaniel Daugherty  LPF:[id number] DOB: Apr 03, 1976 DOA: 06/14/2020 PCP: Jarold Motto, PA    Brief Narrative:  Patient admitted to the hospital with a working diagnosis of acute hypoxic respiratory failure due to SARS COVID-19 viral pneumonia.  44 year old male with past medical history of hypertension, obesity class III and obstructive sleep apnea.  Patient tested positive for COVID-19 August 1 his symptoms have been generalized fatigue, diarrhea and rapid and progressive dyspnea.  On his initial physical examination blood pressure 130/95, heart rate 116, respiratory rate 18, temperature 98.2, oxygen saturation 83%.  His lungs had scattered diffuse rhonchi, heart S1-S2, present rhythmic, abdomen soft, no lower extremity edema Sodium 127, potassium 4.0, chloride 88, bicarb 26, glucose 133, BUN 23, creatinine 1.71, AST 72, ALT 43, CK 1,189, white count 4.9, hemoglobin 18.5, hematocrit 54.8, platelets 210.  SARS COVID-19 positive. Chest radiograph with bilateral interstitial infiltrates, right lower lobe, left lower lobe and left upper lobe.  EKG 133 bpm, normal axis, normal intervals, sinus rhythm, poor R wave progression, no significant ST segment or T wave changes, positive LVH.  Patient with positive fatigue and worsening dyspnea, rapid decline in oxygenation, requiring increase oxygen supplementation, up to on 15 L per HFNC plus non re-breather mask.  Chest film with 3 out of 4 quadrants with interstitial infiltrate  Patient consented to Tocilizumab which was given 06/15/2020.  Today patient with persistent hypoxic respiratory failure but improvement in oxygen requirements.   Assessment & Plan:   Principal Problem:   Acute hypoxemic respiratory failure due to COVID-19 [facility]) Active Problems:   Obesity   Essential hypertension   Hyperlipidemia   OSA on CPAP   1.  Acute hypoxic respiratory failure due to SARS COVID-19 viral pneumonia. tocilizumab 08/05  RR:  19 Pulse oxymetry: 88%  Fi02: 15 L HFNC + NRBM  COVID-19 Labs  Recent Labs    06/14/20 1542 06/14/20 1555 06/14/20 1626 06/15/20 0439 06/16/20 0335  DDIMER 0.79*  --   --  0.66* 0.39  FERRITIN  --  1,644*  --  1,219* 1,135*  LDH  --   --  394*  --   --   CRP  --  5.0*  --  3.0* 0.9    Lab Results  Component Value Date   SARSCOV2NAA POSITIVE (A) 06/14/2020    Inflammatory markers trending down with improvement of dyspnea.   Attempt to reduced oxygen flow to 10 L with close oxymetry monitoring, to keep 02 saturation more than 88%. Continue with high dose systemic steroids, methylprednisolone 60 mg bid, and antiviral therapy with Remdesivir #3/5.   Continue bronchodilators, airway clearing techniques (flutter valve and incentive spirometer) and antitussive agents. Out of bed to chair tid with meals, PT/OT and prone positioning (3 to 4 h at a time for total of 16 H per day as tolerated)   2. Dehydration, AKI, hyponatremia and impeding rahbdomyolisis. Na continue to be low at 126, renal function with serum cr at 1,34 with K at 4,6 and serum bicarbonate at 27. CK now down to 282.   Will hold on IV fluids, following a restrictive IV fluids strategy, follow up on renal panel in am, avoid hypotension and nephrotoxic medications.   3. HTN.  Blood pressure 148.92, will continue amlodipine, continue to hold on diuretic therapy.   4. Leukopenia. Likely reactive to severe viral process. Follow cell count in am.  Patient continue to be at high risk for worsening respiratory failure.    Status is:  Inpatient  Remains inpatient appropriate because:IV treatments appropriate due to intensity of illness or inability to take PO   Dispo: The patient is from: Home              Anticipated d/c is to: Home              Anticipated d/c date is: > 3 days              Patient currently is not medically stable to d/c.   DVT prophylaxis: Enoxaparin   Code Status:   full  Family  Communication:   I spoke over the phone with the patient's friend about patient's  condition, plan of care, prognosis and all questions were addressed.    Subjective: Patient is feeling better, but not yet back to baseline, continue to have significant dyspnea, worse with movement, no nausea or vomiting.   Objective: Vitals:   06/15/20 2000 06/16/20 0000 06/16/20 0338 06/16/20 0500  BP: (!) 145/99 (!) 117/106 (!) 133/100   Pulse: 92 87 90   Resp: (!) 25 16 19    Temp: 97.9 F (36.6 C) (!) 97.4 F (36.3 C) 98.1 F (36.7 C)   TempSrc: Oral Oral Oral   SpO2: 91% (!) 89% (!) 88% 94%  Weight:      Height:        Intake/Output Summary (Last 24 hours) at 06/16/2020 08/16/2020 Last data filed at 06/15/2020 1645 Gross per 24 hour  Intake 465 ml  Output --  Net 465 ml   Filed Weights   06/14/20 1315  Weight: 105.5 kg    Examination:   General: deconditioned and ill looking appearing  Neurology: Awake and alert, non focal  E ENT:  Positive pallor, no icterus, oral mucosa moist Cardiovascular: No JVD. S1-S2 present, rhythmic, no gallops, rubs, or murmurs. No lower extremity edema. Pulmonary: positive breath sounds bilaterally, no wheezing Gastrointestinal. Abdomen soft and non tender Musculoskeletal: no joint deformities     Data Reviewed: I have personally reviewed following labs and imaging studies  CBC: Recent Labs  Lab 06/14/20 1540 06/15/20 0439  WBC 4.9 2.7*  NEUTROABS 2.7 1.9  HGB 18.5* 17.1*  HCT 54.8* 49.8  MCV 88.7 89.2  PLT 210 201   Basic Metabolic Panel: Recent Labs  Lab 06/14/20 1540 06/15/20 0439 06/16/20 0335  NA 127* 126* 129*  K 4.0 4.7 4.6  CL 88* 89* 92*  CO2 26 25 27   GLUCOSE 133* 238* 178*  BUN 23* 25* 20  CREATININE 1.71* 1.58* 1.34*  CALCIUM 8.3* 8.2* 8.1*  MG  --  2.1  --   PHOS  --  2.7  --    GFR: Estimated Creatinine Clearance: 85.4 mL/min (A) (by C-G formula based on SCr of 1.34 mg/dL (H)). Liver Function Tests: Recent Labs  Lab  06/14/20 1540 06/15/20 0439 06/16/20 0335  AST 72* 58* 39  ALT 43 32 30  ALKPHOS 72 67 63  BILITOT 1.1 1.0 0.9  PROT 8.4* 6.5 6.1*  ALBUMIN 4.1 3.0* 2.7*   Recent Labs  Lab 06/14/20 1540  LIPASE 58*   No results for input(s): AMMONIA in the last 168 hours. Coagulation Profile: No results for input(s): INR, PROTIME in the last 168 hours. Cardiac Enzymes: Recent Labs  Lab 06/14/20 1540 06/16/20 0335  CKTOTAL 1,189* 282   BNP (last 3 results) No results for input(s): PROBNP in the last 8760 hours. HbA1C: No results for input(s): HGBA1C in the last 72 hours. CBG: No results  for input(s): GLUCAP in the last 168 hours. Lipid Profile: No results for input(s): CHOL, HDL, LDLCALC, TRIG, CHOLHDL, LDLDIRECT in the last 72 hours. Thyroid Function Tests: No results for input(s): TSH, T4TOTAL, FREET4, T3FREE, THYROIDAB in the last 72 hours. Anemia Panel: Recent Labs    06/15/20 0439 06/16/20 0335  FERRITIN 1,219* 1,135*      Radiology Studies: I have reviewed all of the imaging during this hospital visit personally     Scheduled Meds: . amLODipine  10 mg Oral Daily  . vitamin C  500 mg Oral Daily  . chlorpheniramine-HYDROcodone  5 mL Oral Q12H  . enoxaparin (LOVENOX) injection  0.5 mg/kg Subcutaneous Q24H  . methylPREDNISolone (SOLU-MEDROL) injection  60 mg Intravenous Q12H  . sodium chloride flush  3 mL Intravenous Q12H  . zinc sulfate  220 mg Oral Daily   Continuous Infusions: . sodium chloride    . sodium chloride 75 mL/hr at 06/16/20 0607  . remdesivir 100 mg in NS 100 mL Stopped (06/15/20 1015)     LOS: 2 days        Malone Admire Annett Gula, MD

## 2020-06-17 LAB — CBC WITH DIFFERENTIAL/PLATELET
Abs Immature Granulocytes: 0.05 10*3/uL (ref 0.00–0.07)
Basophils Absolute: 0 10*3/uL (ref 0.0–0.1)
Basophils Relative: 0 %
Eosinophils Absolute: 0 10*3/uL (ref 0.0–0.5)
Eosinophils Relative: 0 %
HCT: 49.8 % (ref 39.0–52.0)
Hemoglobin: 17 g/dL (ref 13.0–17.0)
Immature Granulocytes: 1 %
Lymphocytes Relative: 12 %
Lymphs Abs: 0.7 10*3/uL (ref 0.7–4.0)
MCH: 30.5 pg (ref 26.0–34.0)
MCHC: 34.1 g/dL (ref 30.0–36.0)
MCV: 89.2 fL (ref 80.0–100.0)
Monocytes Absolute: 0.9 10*3/uL (ref 0.1–1.0)
Monocytes Relative: 15 %
Neutro Abs: 4.6 10*3/uL (ref 1.7–7.7)
Neutrophils Relative %: 72 %
Platelets: 262 10*3/uL (ref 150–400)
RBC: 5.58 MIL/uL (ref 4.22–5.81)
RDW: 11.9 % (ref 11.5–15.5)
WBC: 6.3 10*3/uL (ref 4.0–10.5)
nRBC: 0 % (ref 0.0–0.2)

## 2020-06-17 LAB — COMPREHENSIVE METABOLIC PANEL
ALT: 26 U/L (ref 0–44)
AST: 24 U/L (ref 15–41)
Albumin: 3 g/dL — ABNORMAL LOW (ref 3.5–5.0)
Alkaline Phosphatase: 63 U/L (ref 38–126)
Anion gap: 10 (ref 5–15)
BUN: 19 mg/dL (ref 6–20)
CO2: 27 mmol/L (ref 22–32)
Calcium: 8.1 mg/dL — ABNORMAL LOW (ref 8.9–10.3)
Chloride: 92 mmol/L — ABNORMAL LOW (ref 98–111)
Creatinine, Ser: 1.3 mg/dL — ABNORMAL HIGH (ref 0.61–1.24)
GFR calc Af Amer: >60 mL/min (ref >60)
GFR calc non Af Amer: >60 mL/min (ref >60)
Glucose, Bld: 227 mg/dL — ABNORMAL HIGH (ref 70–99)
Potassium: 4.6 mmol/L (ref 3.5–5.1)
Sodium: 129 mmol/L — ABNORMAL LOW (ref 135–145)
Total Bilirubin: 0.9 mg/dL (ref 0.3–1.2)
Total Protein: 6.3 g/dL — ABNORMAL LOW (ref 6.5–8.1)

## 2020-06-17 LAB — D-DIMER, QUANTITATIVE: D-Dimer, Quant: 0.31 ug/mL-FEU (ref 0.00–0.50)

## 2020-06-17 LAB — C-REACTIVE PROTEIN: CRP: 0.6 mg/dL (ref <1.0)

## 2020-06-17 LAB — FERRITIN: Ferritin: 873 ng/mL — ABNORMAL HIGH (ref 24–336)

## 2020-06-17 NOTE — Evaluation (Signed)
Physical Therapy Evaluation Patient Details Name: Nathaniel Daugherty MRN: [id number] DOB: 1976/09/08 Today's Date: 06/17/2020   History of Present Illness  44 year old male with past medical history of hypertension, obesity class III and obstructive sleep apnea.  Patient tested positive for COVID-19 August 1 his symptoms have been generalized fatigue, diarrhea and rapid and progressive dyspnea. Initially required up to on 15 L per HFNC plus non re-breather mask with continued hypoxic respiratory failure.  Clinical Impression  Pt with very limited tolerance of mobility due to increased DOE (3/4) and quick drop in O2 sats to the low 80s on 11 L O2 HFNC.  RR increased to the upper 30s and he takes ~3 mins to recover breathing with seated rest.  Pt had a hard time getting his resting sats >85%, especially supine in the bed.   PT to follow acutely for deficits listed below.      Follow Up Recommendations No PT follow up    Equipment Recommendations  Other (comment) (home O2)    Recommendations for Other Services       Precautions / Restrictions Precautions Precautions: Other (comment) Precaution Comments: monitor O2      Mobility  Bed Mobility Overal bed mobility: Modified Independent                Transfers Overall transfer level: Needs assistance Equipment used: None Transfers: Sit to/from Stand Sit to Stand: Supervision         General transfer comment: supervision for safety  Ambulation/Gait             General Gait Details: NT due to high O2 demands and low tolerance of activity  Stairs            Wheelchair Mobility    Modified Rankin (Stroke Patients Only)       Balance Overall balance assessment: Needs assistance Sitting-balance support: Feet supported;No upper extremity supported Sitting balance-Leahy Scale: Good     Standing balance support: Single extremity supported Standing balance-Leahy Scale: Poor Standing balance comment:  preferred external support of the bed rail in standing. Stood ~1.5 mins before fatigue and needing to sit.  Pt on 11 L O2 HFNC.  RR into the upper 30s and DOE 3/4.  Pt drops sats to low 80s with mobility recovering breathing and RR in ~3 mins of seated rest.                              Pertinent Vitals/Pain Pain Assessment: No/denies pain    Home Living Family/patient expects to be discharged to:: Private residence Living Arrangements: Spouse/significant other Available Help at Discharge: Family;Available 24 hours/day;Other (Comment) (wife works from home, but is also sick with COVID) Type of Home: Apartment Home Access: Level entry     Home Layout: One level Home Equipment: None      Prior Function Level of Independence: Independent         Comments: Independent with ADLs, IADLs mobility without AD. 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   Dominant Hand: Right    Extremity/Trunk Assessment   Upper Extremity Assessment Upper Extremity Assessment: Defer to OT evaluation    Lower Extremity Assessment Lower Extremity Assessment: Overall WFL for tasks assessed    Cervical / Trunk Assessment Cervical / Trunk Assessment: Normal  Communication   Communication: No difficulties  Cognition Arousal/Alertness: Awake/alert Behavior During Therapy: Flat affect Overall Cognitive Status:  Within Functional Limits for tasks assessed                                        General Comments General comments (skin integrity, edema, etc.): Educated on purpose of PT in this setting, importance of sitting up on the side of the bed and getting OOB to chair several times per day (encouraged EOB during meals), and educated on the use of IS and flutter valve which he returned demonstration.     Exercises Other Exercises Other Exercises: Flutter valve x 10, IS x 10 with max inspired volume 500 mL   Assessment/Plan     PT Assessment Patient needs continued PT services  PT Problem List Decreased activity tolerance;Cardiopulmonary status limiting activity       PT Treatment Interventions DME instruction;Gait training;Stair training;Functional mobility training;Therapeutic activities;Therapeutic exercise;Balance training;Patient/family education    PT Goals (Current goals can be found in the Care Plan section)  Acute Rehab PT Goals Patient Stated Goal: breathe better PT Goal Formulation: With patient Time For Goal Achievement: 07/01/20 Potential to Achieve Goals: Good    Frequency Min 3X/week   Barriers to discharge        Co-evaluation               AM-PAC PT "6 Clicks" Mobility  Outcome Measure Help needed turning from your back to your side while in a flat bed without using bedrails?: None Help needed moving from lying on your back to sitting on the side of a flat bed without using bedrails?: None Help needed moving to and from a bed to a chair (including a wheelchair)?: None Help needed standing up from a chair using your arms (e.g., wheelchair or bedside chair)?: None Help needed to walk in hospital room?: A Little Help needed climbing 3-5 steps with a railing? : A Little 6 Click Score: 22    End of Session Equipment Utilized During Treatment: Oxygen Activity Tolerance: Other (comment) (limited by DOE) Patient left: in bed;with call bell/phone within reach Nurse Communication: Mobility status PT Visit Diagnosis: Difficulty in walking, not elsewhere classified (R26.2)    Time: 7026-3785 PT Time Calculation (min) (ACUTE ONLY): 18 min   Charges:   PT Evaluation $PT Eval Moderate Complexity: 1 Mod         Corinna Capra, PT, DPT  Acute Rehabilitation [phone] pager [phone]) [phone] office

## 2020-06-17 NOTE — Evaluation (Signed)
Occupational Therapy Evaluation Patient Details Name: Nathaniel Daugherty MRN: [id number] DOB: May 06, 1976 Today's Date: 06/17/2020    History of Present Illness 44 year old male with past medical history of hypertension, obesity class III and obstructive sleep apnea.  Patient tested positive for COVID-19 August 1 his symptoms have been generalized fatigue, diarrhea and rapid and progressive dyspnea. Initially required up to on 15 L per HFNC plus non re-breather mask with continued hypoxic respiratory failure.   Clinical Impression   PTA, pt lives with fiance and reports Independence in ADLs, iADLs and mobility without AD. Pt works full-time in Research officer, trade [employer]. Pt received now on 12 L HFNC, 9 L NRB mask at 88% at rest in bed. Pt requires no more than Supervision for bed mobility and simple transfers without AD, but quickly desats to low-mid 80s. Due to decreased cardiopulmonary tolerance, pt requires up to Min A for LB ADLs. Pt initially motivated to ambulate to bathroom with OT, but experienced coughing fit sitting EOB with onset of fatigue. During coughing fit sitting EOB and removal of O2 to cough up sputum, pt desats to 67-71%. Guided pt in pursed lip breathing with pt able to demonstrate recovery to upper 80s within 2-3 minutes. Left in recliner chair on 12 L HFNC, 4 L NRB mask with RN present and aware.     Follow Up Recommendations  Home health OT;Supervision/Assistance - 24 hour (may progress to no OT-follow up)    Equipment Recommendations  3 in 1 bedside commode    Recommendations for Other Services       Precautions / Restrictions Precautions Precautions: Fall Precaution Comments: monitor O2 Restrictions Weight Bearing Restrictions: No      Mobility Bed Mobility Overal bed mobility: Needs Assistance Bed Mobility: Supine to Sit     Supine to sit: Supervision     General bed mobility comments: Supervision for safety with lines, no physical assist  needed  Transfers Overall transfer level: Needs assistance Equipment used: None Transfers: Sit to/from Stand;Stand Pivot Transfers Sit to Stand: Supervision Stand pivot transfers: Supervision       General transfer comment: Supervision for safety/lines, no physical assist needed    Balance Overall balance assessment: Needs assistance Sitting-balance support: Bilateral upper extremity supported;Feet supported Sitting balance-Leahy Scale: Good Sitting balance - Comments: fair static sitting balance, min guard at times during violent coughing fits   Standing balance support: No upper extremity supported;During functional activity Standing balance-Leahy Scale: Good Standing balance comment: no use of AD during pivot                           ADL either performed or assessed with clinical judgement   ADL Overall ADL's : Needs assistance/impaired Eating/Feeding: Sitting;Set up Eating/Feeding Details (indicate cue type and reason): Setup to drink from water cup EOB Grooming: Supervision/safety;Sitting Grooming Details (indicate cue type and reason): Supervision sitting EOB to wipe face/mouth after coughing fit. Supervision required to monitor O2 stats due to removal of NRB Upper Body Bathing: Minimal assistance;Sitting   Lower Body Bathing: Minimal assistance;Sit to/from stand   Upper Body Dressing : Supervision/safety;Sitting   Lower Body Dressing: Minimal assistance;Sit to/from stand   Toilet Transfer: Administrator, sports Details (indicate cue type and reason): simulated to recliner without AD. Supervision for safety/mgmt of lines and monitoring of O2, no physical assist needed Toileting- Clothing Manipulation and Hygiene: Min guard;Sit to/from stand         General ADL Comments: Pt  overall physically capable of completing daily tasks, but limited by severely decreased cardiopulmonary tolerance.      Vision Baseline  Vision/History: No visual deficits Patient Visual Report: No change from baseline Vision Assessment?: No apparent visual deficits     Perception     Praxis      Pertinent Vitals/Pain Pain Assessment: Faces Faces Pain Scale: Hurts little more Pain Location: chest when coughing Pain Descriptors / Indicators: Burning;Sore;Grimacing;Guarding Pain Intervention(s): Monitored during session;Other (comment) (educated to hug pillow when coughing to assist w discomfort)     Hand Dominance Right   Extremity/Trunk Assessment Upper Extremity Assessment Upper Extremity Assessment: Overall WFL for tasks assessed   Lower Extremity Assessment Lower Extremity Assessment: Defer to PT evaluation   Cervical / Trunk Assessment Cervical / Trunk Assessment: Normal   Communication Communication Communication: No difficulties   Cognition Arousal/Alertness: Awake/alert Behavior During Therapy: WFL for tasks assessed/performed Overall Cognitive Status: Within Functional Limits for tasks assessed                                     General Comments  Pt received on 12 HFNC, 9 NRB mask stats at 88% at rest. When sitting EOB, pt with frequent productive coughing fit. Pt then removed [place]/NRB for coughing/spitting into tissue with desats to 67-71%. After coughing fit, replaced [place] with sustained low 80s% sitting EOB increasing to 88% with > 2 minutes of pursed lip breathing coaching. Pt at 88% after transfer, but slow decrease to mid 80s with just [place]. Placed 12 L HFNC, 4 L NRB on with sustained 88%. RN present and aware    Exercises     Shoulder Instructions      Home Living Family/patient expects to be discharged to:: Private residence Living Arrangements: Spouse/significant other Available Help at Discharge: Family;Available 24 hours/day (fiance works from home) Type of Home: Apartment Home Access: Level entry     Home Layout: One level     Bathroom Shower/Tub: Scientist, research (life sciences): Standard     Home Equipment: None          Prior Functioning/Environment Level of Independence: Independent        Comments: Independent with ADLs, IADLs mobility without AD. 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        OT Problem List: Decreased activity tolerance;Cardiopulmonary status limiting activity      OT Treatment/Interventions: Self-care/ADL training;Therapeutic exercise;Energy conservation;DME and/or AE instruction;Therapeutic activities;Patient/family education    OT Goals(Current goals can be found in the care plan section) Acute Rehab OT Goals Patient Stated Goal: breathe better OT Goal Formulation: With patient Time For Goal Achievement: 07/01/20 Potential to Achieve Goals: Good ADL Goals Pt Will Perform Lower Body Dressing: with supervision;sit to/from stand Pt Will Transfer to Toilet: with supervision;ambulating;regular height toilet;bedside commode Pt Will Perform Toileting - Clothing Manipulation and hygiene: with modified independence;sit to/from stand Additional ADL Goal #1: Pt to verbalize at least 3 energy conservation strategies to maximize ADL/IADL independence  OT Frequency: Min 3X/week   Barriers to D/C:            Co-evaluation              AM-PAC OT "6 Clicks" Daily Activity     Outcome Measure Help from another person eating meals?: A Little Help from another person taking care of personal grooming?: A Little Help from another person toileting,  which includes using toliet, bedpan, or urinal?: A Little Help from another person bathing (including washing, rinsing, drying)?: A Little Help from another person to put on and taking off regular upper body clothing?: A Little Help from another person to put on and taking off regular lower body clothing?: A Little 6 Click Score: 18   End of Session Equipment Utilized During Treatment: Oxygen Nurse Communication: Mobility status;Other (comment)  (O2)  Activity Tolerance: Treatment limited secondary to medical complications (Comment) Patient left: in chair;with call bell/phone within reach;with chair alarm set  OT Visit Diagnosis: Other (comment) (decreased cardiopulmonary tolerance)                Time: 9892-1194 OT Time Calculation (min): 33 min Charges:  OT General Charges $OT Visit: 1 Visit OT Evaluation $OT Eval Moderate Complexity: 1 Mod OT Treatments $Therapeutic Activity: 8-22 mins  Lorre Munroe, OTR/L  Lorre Munroe 06/17/2020, 1:20 PM

## 2020-06-17 NOTE — Progress Notes (Signed)
PROGRESS NOTE                                                                                                                                                                                                             Patient Demographics:    Nathaniel Daugherty, is a 44 y.o. male, DOB - July 04, 1976, UXN:[id number]  Outpatient Primary MD for the patient is Jarold Motto, [name]   Admit date - 06/14/2020   LOS - 3  Chief Complaint  Patient presents with  . Shortness of Breath       Brief Narrative: Patient is a 44 y.o. male with PMHx of HTN, OSA, obesity-who tested positive for COVID-19 (unvaccinated) on 8/1-presented to the hospital with worsening dyspnea, fatigue-found to have acute hypoxic respiratory failure secondary to COVID-19 pneumonia.  Significant Events: 8/4>> Admit to [facility] for worsening shortness of breath-found to have hypoxemia secondary to COVID-19.  Significant studies: 8/4>>Chest x-ray: Diffuse bilateral infiltrates.  COVID-19 medications: Steroids: 8/4>> Remdesivir: 8/4>> Actemra: 8/5 x 1  Antibiotics: None  Microbiology data: None  Procedures: None  Consults: None  DVT prophylaxis: Lovenox   Subjective:    Eilan Brem today remains unchanged-complains of shortness of breath with minimal exertion.  Appears comfortable at rest.   Assessment  & Plan :   Acute Hypoxic Resp Failure due to Covid 19 Viral pneumonia: Continues to be severely hypoxemic-requiring 10-12 L of HFNC-continue supportive care-continue steroids/remdesivir.  Continue with attempts to slowly titrate down FiO2.    Fever: afebrile O2 requirements:  SpO2: 95 % O2 Flow Rate (L/min): 10 L/min   COVID-19 Labs: Recent Labs    06/14/20 1555 06/14/20 1626 06/15/20 0439 06/16/20 0335 06/17/20 0501  DDIMER   < >  --  0.66* 0.39 0.31  FERRITIN   < >  --  1,219* 1,135* 873*  LDH  --  394*  --   --   --   CRP   < >   --  3.0* 0.9 0.6   < > = values in this interval not displayed.    No results found for: BNP  Recent Labs  Lab 06/14/20 1626  PROCALCITON 0.37    Lab Results  Component Value Date   SARSCOV2NAA POSITIVE (A) 06/14/2020     Prone/Incentive Spirometry: encouraged patient to lie prone for 3-4 hours at a time  for a total of 16 hours a day, and to encourage incentive spirometry use 3-4/hour  AKI: Likely hemodynamically mediated-improving-continue supportive care.  Hyponatremia: Appears to be mild-volume status stable-continue close monitoring-if worsens significantly-we can consider further work-up.  HTN: BP controlled-continue amlodipine.  OSA: CPAP nightly  Morbid Obesity: Estimated body mass index is 35.36 kg/m as calculated from the following:   Height as of this encounter: 5\' 8"  (1.727 m).   Weight as of this encounter: 105.5 kg.   ABG: No results found for: PHART, PCO2ART, PO2ART, HCO3, TCO2, ACIDBASEDEF, O2SAT    Condition -  Guarded  Family Communication  :  Spouse updated over the phone on 8/7 (was on FaceTime with the patient while I was rounding)  Code Status :  Full Code  Diet :  Diet Order            Diet regular Room service appropriate? Yes; Fluid consistency: Thin  Diet effective now                  Disposition Plan  :   Status is: Inpatient  Remains inpatient appropriate because:Inpatient level of care appropriate due to severity of illness   Dispo: The patient is from: Home              Anticipated d/c is to: Home              Anticipated d/c date is: > 3 days              Patient currently is not medically stable to d/c.    Barriers to discharge: Severe Hypoxia requiring O2 supplementation/complete 5 days of IV Remdesivir  Antimicorbials  :    Anti-infectives (From admission, onward)   Start     Dose/Rate Route Frequency Ordered Stop   06/15/20 1000  remdesivir 100 mg in sodium chloride 0.9 % 100 mL IVPB     Discontinue      "Followed by" Linked Group Details   100 mg 200 mL/hr over 30 Minutes Intravenous Daily 06/14/20 1654 06/19/20 0959   06/14/20 1800  remdesivir 200 mg in sodium chloride 0.9% 250 mL IVPB       "Followed by" Linked Group Details   200 mg 580 mL/hr over 30 Minutes Intravenous Once 06/14/20 1654 06/14/20 2043      Inpatient Medications  Scheduled Meds: . amLODipine  10 mg Oral Daily  . vitamin C  500 mg Oral Daily  . chlorpheniramine-HYDROcodone  5 mL Oral Q12H  . enoxaparin (LOVENOX) injection  0.5 mg/kg Subcutaneous Q24H  . famotidine  20 mg Oral Daily  . methylPREDNISolone (SOLU-MEDROL) injection  60 mg Intravenous Q12H  . sodium chloride flush  3 mL Intravenous Q12H  . zinc sulfate  220 mg Oral Daily   Continuous Infusions: . sodium chloride    . remdesivir 100 mg in NS 100 mL 100 mg (06/17/20 0933)   PRN Meds:.sodium chloride, acetaminophen, albuterol, bisacodyl, guaiFENesin-dextromethorphan, ondansetron **OR** ondansetron (ZOFRAN) IV, oxyCODONE, phenol, polyethylene glycol, sodium chloride flush, sodium phosphate   Time Spent in minutes  25  See all Orders from today for further details   08/17/20 M.D on 06/17/2020 at 1:48 PM  To page go to [email or web address] - use universal password  [facility] -  Office  [phone]    Objective:   Vitals:   06/16/20 1349 06/16/20 2100 06/17/20 0453 06/17/20 0757  BP: (!) 134/92 (!) 128/91 (!) 141/93 (!) 142/99  Pulse: 100 84 83  Resp: (!) 23 19 20  (!) 22  Temp: 98.1 F (36.7 C) 97.9 F (36.6 C) 97.7 F (36.5 C) 97.9 F (36.6 C)  TempSrc: Oral Oral Axillary Oral  SpO2: 92% (!) 89% 94% 95%  Weight:      Height:        Wt Readings from Last 3 Encounters:  06/14/20 105.5 kg  01/03/20 105.5 kg  09/27/19 105.9 kg     Intake/Output Summary (Last 24 hours) at 06/17/2020 1348 Last data filed at 06/17/2020 0502 Gross per 24 hour  Intake --  Output 500 ml  Net -500 ml     Physical Exam Gen Exam:Alert  awake-not in any distress HEENT:atraumatic, normocephalic Chest: B/L clear to auscultation anteriorly CVS:S1S2 regular Abdomen:soft non tender, non distended Extremities:no edema Neurology: Non focal Skin: no rash   Data Review:    CBC Recent Labs  Lab 06/14/20 1540 06/15/20 0439 06/17/20 0501  WBC 4.9 2.7* 6.3  HGB 18.5* 17.1* 17.0  HCT 54.8* 49.8 49.8  PLT 210 201 262  MCV 88.7 89.2 89.2  MCH 29.9 30.6 30.5  MCHC 33.8 34.3 34.1  RDW 11.9 12.0 11.9  LYMPHSABS 1.6 0.6* 0.7  MONOABS 0.7 0.2 0.9  EOSABS 0.0 0.0 0.0  BASOSABS 0.0 0.0 0.0    Chemistries  Recent Labs  Lab 06/14/20 1540 06/15/20 0439 06/16/20 0335 06/17/20 0501  NA 127* 126* 129* 129*  K 4.0 4.7 4.6 4.6  CL 88* 89* 92* 92*  CO2 26 25 27 27   GLUCOSE 133* 238* 178* 227*  BUN 23* 25* 20 19  CREATININE 1.71* 1.58* 1.34* 1.30*  CALCIUM 8.3* 8.2* 8.1* 8.1*  MG  --  2.1  --   --   AST 72* 58* 39 24  ALT 43 32 30 26  ALKPHOS 72 67 63 63  BILITOT 1.1 1.0 0.9 0.9   ------------------------------------------------------------------------------------------------------------------ No results for input(s): CHOL, HDL, LDLCALC, TRIG, CHOLHDL, LDLDIRECT in the last 72 hours.  Lab Results  Component Value Date   HGBA1C 6.1 09/27/2019   ------------------------------------------------------------------------------------------------------------------ No results for input(s): TSH, T4TOTAL, T3FREE, THYROIDAB in the last 72 hours.  Invalid input(s): FREET3 ------------------------------------------------------------------------------------------------------------------ Recent Labs    06/16/20 0335 06/17/20 0501  FERRITIN 1,135* 873*    Coagulation profile No results for input(s): INR, PROTIME in the last 168 hours.  Recent Labs    06/16/20 0335 06/17/20 0501  DDIMER 0.39 0.31    Cardiac Enzymes No results for input(s): CKMB, TROPONINI, MYOGLOBIN in the last 168 hours.  Invalid input(s):  CK ------------------------------------------------------------------------------------------------------------------ No results found for: BNP  Micro Results Recent Results (from the past 240 hour(s))  SARS Coronavirus 2 by RT PCR (hospital order, performed in [facility] hospital lab) Nasopharyngeal Nasopharyngeal Swab     Status: Abnormal   Collection Time: 06/14/20  3:03 PM   Specimen: Nasopharyngeal Swab  Result Value Ref Range Status   SARS Coronavirus 2 POSITIVE (A) NEGATIVE Final    Comment: RESULT CALLED TO, READ BACK BY AND VERIFIED WITH: W,VENEGAS @1727  06/14/20 EB (NOTE) SARS-CoV-2 target nucleic acids are DETECTED  SARS-CoV-2 RNA is generally detectable in upper respiratory specimens  during the acute phase of infection.  Positive results are indicative  of the presence of the identified virus, but do not rule out bacterial infection or co-infection with other pathogens not detected by the test.  Clinical correlation with patient history and  other diagnostic information is necessary to determine patient infection status.  The expected result is negative.  Fact  Sheet for Patients:   BoilerBrush.com.cyhttps://www.fda.gov/media/136312/download   Fact Sheet for Healthcare Providers:   https://pope.com/https://www.fda.gov/media/136313/download    This test is not yet approved or cleared by the [place] FDA and  has been authorized for detection and/or diagnosis of SARS-CoV-2 by FDA under an Emergency Use Authorization (EUA).  This EUA will remain in effect (meaning this test can be  used) for the duration of  the COVID-19 declaration under Section 564(b)(1) of the Act, 21 U.S.C. section 360-bbb-3(b)(1), unless the authorization is terminated or revoked sooner.  Performed at [facility], [address] [place], [place] [address]     Radiology Reports DG Chest Portable 1 View  Result Date: 06/14/2020 CLINICAL DATA:  Pt tested positive for Covid on Sunday, having increase SOB, fever and  generalized body aches EXAM: PORTABLE CHEST 1 VIEW COMPARISON:  None. FINDINGS: The heart size and mediastinal contours are within normal limits. Low lung volumes. There are diffuse bilateral infiltrates. No pneumothorax or large pleural effusion. No acute finding in the visualized skeleton. IMPRESSION: Diffuse bilateral infiltrates concerning for multifocal pneumonia. Electronically Signed   By: Emmaline KluverNancy  Ballantyne M.D.   On: 06/14/2020 14:10

## 2020-06-17 NOTE — Progress Notes (Signed)
RT attempted to place patient on CPAP, patient desaturated to 79%. Non-rebreather placed along with HFNC at 15L. Patient is unable to wear CPAP with a non rebreather in place.

## 2020-06-18 LAB — GLUCOSE, CAPILLARY
Glucose-Capillary: 253 mg/dL — ABNORMAL HIGH (ref 70–99)
Glucose-Capillary: 237 mg/dL — ABNORMAL HIGH (ref 70–99)
Glucose-Capillary: 291 mg/dL — ABNORMAL HIGH (ref 70–99)
Glucose-Capillary: 369 mg/dL — ABNORMAL HIGH (ref 70–99)

## 2020-06-18 LAB — COMPREHENSIVE METABOLIC PANEL
ALT: 23 U/L (ref 0–44)
AST: 20 U/L (ref 15–41)
Albumin: 2.9 g/dL — ABNORMAL LOW (ref 3.5–5.0)
Alkaline Phosphatase: 61 U/L (ref 38–126)
Anion gap: 8 (ref 5–15)
BUN: 19 mg/dL (ref 6–20)
CO2: 29 mmol/L (ref 22–32)
Calcium: 8.1 mg/dL — ABNORMAL LOW (ref 8.9–10.3)
Chloride: 93 mmol/L — ABNORMAL LOW (ref 98–111)
Creatinine, Ser: 1.25 mg/dL — ABNORMAL HIGH (ref 0.61–1.24)
GFR calc Af Amer: >60 mL/min (ref >60)
GFR calc non Af Amer: >60 mL/min (ref >60)
Glucose, Bld: 274 mg/dL — ABNORMAL HIGH (ref 70–99)
Potassium: 4.9 mmol/L (ref 3.5–5.1)
Sodium: 130 mmol/L — ABNORMAL LOW (ref 135–145)
Total Bilirubin: 0.9 mg/dL (ref 0.3–1.2)
Total Protein: 5.8 g/dL — ABNORMAL LOW (ref 6.5–8.1)

## 2020-06-18 LAB — FERRITIN: Ferritin: 656 ng/mL — ABNORMAL HIGH (ref 24–336)

## 2020-06-18 LAB — D-DIMER, QUANTITATIVE: D-Dimer, Quant: 0.41 ug/mL-FEU (ref 0.00–0.50)

## 2020-06-18 LAB — C-REACTIVE PROTEIN: CRP: <0.5 mg/dL (ref <1.0)

## 2020-06-18 MED ORDER — INSULIN ASPART 100 UNIT/ML ~~LOC~~ SOLN
0.0000 [IU] | Freq: Three times a day (TID) | SUBCUTANEOUS | Status: DC
  Administered 2020-06-18 (×2): 5 [IU] via SUBCUTANEOUS
  Administered 2020-06-18 – 2020-06-19 (×2): 3 [IU] via SUBCUTANEOUS
  Administered 2020-06-19: 7 [IU] via SUBCUTANEOUS

## 2020-06-18 NOTE — Progress Notes (Signed)
Pt stated he doesn't want to wear CPAP tonight.  He stated he will wear 02 by [place].  Pt is on 7L HFNC, Sp02 is 95% no distress noted.

## 2020-06-18 NOTE — Progress Notes (Addendum)
Encouraged patient to stay in chair for the day. Patient did state that the chair is hard to sit on and that it's not soft and hurts him.   Patient sat up for breakfast.  Tech put patient back into his bed and patient sat up for lunch. Patient denies any chest pain but still feels short of breath.  Patient O2 flow rate is at 10 LPM at this time and his SpO2 is holding around high 80's low 90's.  Patient in no acute distress.  Patient encouraged to use incentive spirometer and flutter valve at least 5 times per hour.  Patient is using both of these items.   MD aware of patient status.

## 2020-06-18 NOTE — Progress Notes (Signed)
PROGRESS NOTE                                                                                                                                                                                                             Patient Demographics:    Nathaniel Daugherty, is a 44 y.o. male, DOB - 01-Oct-1976, FAO:[id number]  Outpatient Primary MD for the patient is Jarold Motto, [name]   Admit date - 06/14/2020   LOS - 4  Chief Complaint  Patient presents with  . Shortness of Breath       Brief Narrative: Patient is a 44 y.o. male with PMHx of HTN, OSA, obesity-who tested positive for COVID-19 (unvaccinated) on 8/1-presented to the hospital with worsening dyspnea, fatigue-found to have acute hypoxic respiratory failure secondary to COVID-19 pneumonia.  Significant Events: 8/4>> Admit to [facility] for worsening shortness of breath-found to have hypoxemia secondary to COVID-19.  Significant studies: 8/4>>Chest x-ray: Diffuse bilateral infiltrates.  COVID-19 medications: Steroids: 8/4>> Remdesivir: 8/4>>8/8 Actemra: 8/5 x 1  Antibiotics: None  Microbiology data: None  Procedures: None  Consults: None  DVT prophylaxis: Lovenox   Subjective:    Nathaniel Daugherty was sitting at bedside chair-he feels the same-only complaint was that his bottom was hurting from sitting on the chair.  No chest pain.   Assessment  & Plan :   Acute Hypoxic Resp Failure due to Covid 19 Viral pneumonia: Continues to be significantly hypoxemic-but not on any distress-remains on around 12 L of HFNC.  Will complete remdesivir on 8/8.  Remains on steroids.  No evidence of volume overload.  Continue attempts to slowly titrate down FiO2.  Continue to monitor closely.  Fever: afebrile O2 requirements:  SpO2: 91 % O2 Flow Rate (L/min): 12 L/min FiO2 (%): 100 %   COVID-19 Labs: Recent Labs    06/16/20 0335 06/17/20 0501 06/18/20 0451  DDIMER  0.39 0.31 0.41  FERRITIN 1,135* 873* 656*  CRP 0.9 0.6 <0.5    No results found for: BNP  Recent Labs  Lab 06/14/20 1626  PROCALCITON 0.37    Lab Results  Component Value Date   SARSCOV2NAA POSITIVE (A) 06/14/2020     Prone/Incentive Spirometry: encouraged patient to lie prone for 3-4 hours at a time for a total of 16 hours a day, and to encourage incentive spirometry use 3-4/hour  AKI:  Likely hemodynamically mediated-improving-continue supportive care.  Hyponatremia: Appears to be mild-volume status stable-continue close monitoring-if worsens significantly-we can consider further work-up.  HTN: BP controlled-continue amlodipine.  OSA: Could not tolerate CPAP nightly-remains on HFNC  Morbid Obesity: Estimated body mass index is 35.36 kg/m as calculated from the following:   Height as of this encounter: 5\' 8"  (1.727 m).   Weight as of this encounter: 105.5 kg.   ABG: No results found for: PHART, PCO2ART, PO2ART, HCO3, TCO2, ACIDBASEDEF, O2SAT FiO2 (%):  [100 %] 100 %  Condition -  Guarded  Family Communication  : Significant other ([id number]([phone]) 8/8  Code Status :  Full Code  Diet :  Diet Order            Diet regular Room service appropriate? Yes; Fluid consistency: Thin  Diet effective now                  Disposition Plan  :   Status is: Inpatient  Remains inpatient appropriate because:Inpatient level of care appropriate due to severity of illness   Dispo: The patient is from: Home              Anticipated d/c is to: Home              Anticipated d/c date is: > 3 days              Patient currently is not medically stable to d/c.    Barriers to discharge: Severe Hypoxia requiring O2 supplementation/complete 5 days of IV Remdesivir  Antimicorbials  :    Anti-infectives (From admission, onward)   Start     Dose/Rate Route Frequency Ordered Stop   06/15/20 1000  remdesivir 100 mg in sodium chloride 0.9 % 100 mL IVPB       "Followed by"  Linked Group Details   100 mg 200 mL/hr over 30 Minutes Intravenous Daily 06/14/20 1654 06/18/20 1044   06/14/20 1800  remdesivir 200 mg in sodium chloride 0.9% 250 mL IVPB       "Followed by" Linked Group Details   200 mg 580 mL/hr over 30 Minutes Intravenous Once 06/14/20 1654 06/14/20 2043      Inpatient Medications  Scheduled Meds: . amLODipine  10 mg Oral Daily  . vitamin C  500 mg Oral Daily  . chlorpheniramine-HYDROcodone  5 mL Oral Q12H  . enoxaparin (LOVENOX) injection  0.5 mg/kg Subcutaneous Q24H  . famotidine  20 mg Oral Daily  . insulin aspart  0-9 Units Subcutaneous TID WC  . methylPREDNISolone (SOLU-MEDROL) injection  60 mg Intravenous Q12H  . sodium chloride flush  3 mL Intravenous Q12H  . zinc sulfate  220 mg Oral Daily   Continuous Infusions: . sodium chloride     PRN Meds:.sodium chloride, acetaminophen, albuterol, bisacodyl, guaiFENesin-dextromethorphan, ondansetron **OR** ondansetron (ZOFRAN) IV, oxyCODONE, phenol, polyethylene glycol, sodium chloride flush, sodium phosphate   Time Spent in minutes  25  See all Orders from today for further details   Jeoffrey MassedShanker Rhyder Koegel M.D on 06/18/2020 at 10:50 AM  To page go to [email or web address] - use universal password  [facility] -  Office  [phone]    Objective:   Vitals:   06/17/20 2053 06/17/20 2317 06/18/20 0442 06/18/20 0744  BP: (!) 138/97 (!) 128/96 124/83 124/83  Pulse: 90 70 64 85  Resp: (!) 23 19 (!) 22 14  Temp: 97.6 F (36.4 C) 98 F (36.7 C) 98.4 F (36.9 C) 97.6 F (36.4 C)  TempSrc: Oral  Oral Oral Oral  SpO2: (!) 83% 91% 94% 91%  Weight:      Height:        Wt Readings from Last 3 Encounters:  06/14/20 105.5 kg  01/03/20 105.5 kg  09/27/19 105.9 kg     Intake/Output Summary (Last 24 hours) at 06/18/2020 1050 Last data filed at 06/18/2020 0908 Gross per 24 hour  Intake 135 ml  Output --  Net 135 ml     Physical Exam Gen Exam:Alert awake-not in any  distress HEENT:atraumatic, normocephalic Chest: B/L clear to auscultation anteriorly CVS:S1S2 regular Abdomen:soft non tender, non distended Extremities:no edema Neurology: Non focal Skin: no rash   Data Review:    CBC Recent Labs  Lab 06/14/20 1540 06/15/20 0439 06/17/20 0501  WBC 4.9 2.7* 6.3  HGB 18.5* 17.1* 17.0  HCT 54.8* 49.8 49.8  PLT 210 201 262  MCV 88.7 89.2 89.2  MCH 29.9 30.6 30.5  MCHC 33.8 34.3 34.1  RDW 11.9 12.0 11.9  LYMPHSABS 1.6 0.6* 0.7  MONOABS 0.7 0.2 0.9  EOSABS 0.0 0.0 0.0  BASOSABS 0.0 0.0 0.0    Chemistries  Recent Labs  Lab 06/14/20 1540 06/15/20 0439 06/16/20 0335 06/17/20 0501 06/18/20 0451  NA 127* 126* 129* 129* 130*  K 4.0 4.7 4.6 4.6 4.9  CL 88* 89* 92* 92* 93*  CO2 26 25 27 27 29   GLUCOSE 133* 238* 178* 227* 274*  BUN 23* 25* 20 19 19   CREATININE 1.71* 1.58* 1.34* 1.30* 1.25*  CALCIUM 8.3* 8.2* 8.1* 8.1* 8.1*  MG  --  2.1  --   --   --   AST 72* 58* 39 24 20  ALT 43 32 30 26 23   ALKPHOS 72 67 63 63 61  BILITOT 1.1 1.0 0.9 0.9 0.9   ------------------------------------------------------------------------------------------------------------------ No results for input(s): CHOL, HDL, LDLCALC, TRIG, CHOLHDL, LDLDIRECT in the last 72 hours.  Lab Results  Component Value Date   HGBA1C 6.1 09/27/2019   ------------------------------------------------------------------------------------------------------------------ No results for input(s): TSH, T4TOTAL, T3FREE, THYROIDAB in the last 72 hours.  Invalid input(s): FREET3 ------------------------------------------------------------------------------------------------------------------ Recent Labs    06/17/20 0501 06/18/20 0451  FERRITIN 873* 656*    Coagulation profile No results for input(s): INR, PROTIME in the last 168 hours.  Recent Labs    06/17/20 0501 06/18/20 0451  DDIMER 0.31 0.41    Cardiac Enzymes No results for input(s): CKMB, TROPONINI, MYOGLOBIN in  the last 168 hours.  Invalid input(s): CK ------------------------------------------------------------------------------------------------------------------ No results found for: BNP  Micro Results Recent Results (from the past 240 hour(s))  SARS Coronavirus 2 by RT PCR (hospital order, performed in [facility] hospital lab) Nasopharyngeal Nasopharyngeal Swab     Status: Abnormal   Collection Time: 06/14/20  3:03 PM   Specimen: Nasopharyngeal Swab  Result Value Ref Range Status   SARS Coronavirus 2 POSITIVE (A) NEGATIVE Final    Comment: RESULT CALLED TO, READ BACK BY AND VERIFIED WITH: W,VENEGAS @1727  06/14/20 EB (NOTE) SARS-CoV-2 target nucleic acids are DETECTED  SARS-CoV-2 RNA is generally detectable in upper respiratory specimens  during the acute phase of infection.  Positive results are indicative  of the presence of the identified virus, but do not rule out bacterial infection or co-infection with other pathogens not detected by the test.  Clinical correlation with patient history and  other diagnostic information is necessary to determine patient infection status.  The expected result is negative.  Fact Sheet for Patients:   [facility]   Fact Sheet for  Healthcare Providers:   https://pope.com/    This test is not yet approved or cleared by the [place] and  has been authorized for detection and/or diagnosis of SARS-CoV-2 by FDA under an Emergency Use Authorization (EUA).  This EUA will remain in effect (meaning this test can be  used) for the duration of  the COVID-19 declaration under Section 564(b)(1) of the Act, 21 U.S.C. section 360-bbb-3(b)(1), unless the authorization is terminated or revoked sooner.  Performed at [facility], [address], [place], [place] [address]     Radiology Reports DG Chest Portable 1 View  Result Date: 06/14/2020 CLINICAL DATA:  Pt tested positive for Covid on  Sunday, having increase SOB, fever and generalized body aches EXAM: PORTABLE CHEST 1 VIEW COMPARISON:  None. FINDINGS: The heart size and mediastinal contours are within normal limits. Low lung volumes. There are diffuse bilateral infiltrates. No pneumothorax or large pleural effusion. No acute finding in the visualized skeleton. IMPRESSION: Diffuse bilateral infiltrates concerning for multifocal pneumonia. Electronically Signed   By: Emmaline Kluver M.D.   On: 06/14/2020 14:10

## 2020-06-19 LAB — GLUCOSE, CAPILLARY
Glucose-Capillary: 240 mg/dL — ABNORMAL HIGH (ref 70–99)
Glucose-Capillary: 316 mg/dL — ABNORMAL HIGH (ref 70–99)
Glucose-Capillary: 308 mg/dL — ABNORMAL HIGH (ref 70–99)
Glucose-Capillary: 190 mg/dL — ABNORMAL HIGH (ref 70–99)

## 2020-06-19 LAB — COMPREHENSIVE METABOLIC PANEL
ALT: 27 U/L (ref 0–44)
AST: 19 U/L (ref 15–41)
Albumin: 2.9 g/dL — ABNORMAL LOW (ref 3.5–5.0)
Alkaline Phosphatase: 61 U/L (ref 38–126)
Anion gap: 5 (ref 5–15)
BUN: 21 mg/dL — ABNORMAL HIGH (ref 6–20)
CO2: 33 mmol/L — ABNORMAL HIGH (ref 22–32)
Calcium: 8.2 mg/dL — ABNORMAL LOW (ref 8.9–10.3)
Chloride: 95 mmol/L — ABNORMAL LOW (ref 98–111)
Creatinine, Ser: 1.33 mg/dL — ABNORMAL HIGH (ref 0.61–1.24)
GFR calc Af Amer: >60 mL/min (ref >60)
GFR calc non Af Amer: >60 mL/min (ref >60)
Glucose, Bld: 261 mg/dL — ABNORMAL HIGH (ref 70–99)
Potassium: 4.9 mmol/L (ref 3.5–5.1)
Sodium: 133 mmol/L — ABNORMAL LOW (ref 135–145)
Total Bilirubin: 0.9 mg/dL (ref 0.3–1.2)
Total Protein: 5.9 g/dL — ABNORMAL LOW (ref 6.5–8.1)

## 2020-06-19 LAB — CBC
HCT: 50.4 % (ref 39.0–52.0)
Hemoglobin: 17 g/dL (ref 13.0–17.0)
MCH: 30.7 pg (ref 26.0–34.0)
MCHC: 33.7 g/dL (ref 30.0–36.0)
MCV: 91.1 fL (ref 80.0–100.0)
Platelets: 285 10*3/uL (ref 150–400)
RBC: 5.53 MIL/uL (ref 4.22–5.81)
RDW: 12 % (ref 11.5–15.5)
WBC: 9.4 10*3/uL (ref 4.0–10.5)
nRBC: 0 % (ref 0.0–0.2)

## 2020-06-19 LAB — HEMOGLOBIN A1C
Hgb A1c MFr Bld: 6.4 % — ABNORMAL HIGH (ref 4.8–5.6)
Mean Plasma Glucose: 136.98 mg/dL

## 2020-06-19 LAB — D-DIMER, QUANTITATIVE: D-Dimer, Quant: 0.51 ug/mL-FEU — ABNORMAL HIGH (ref 0.00–0.50)

## 2020-06-19 LAB — FERRITIN: Ferritin: 640 ng/mL — ABNORMAL HIGH (ref 24–336)

## 2020-06-19 LAB — C-REACTIVE PROTEIN: CRP: <0.5 mg/dL (ref <1.0)

## 2020-06-19 MED ORDER — ENOXAPARIN SODIUM 60 MG/0.6ML ~~LOC~~ SOLN
50.0000 mg | SUBCUTANEOUS | Status: DC
  Administered 2020-06-19 – 2020-06-22 (×4): 50 mg via SUBCUTANEOUS
  Filled 2020-06-19 (×4): qty 0.6

## 2020-06-19 MED ORDER — INSULIN ASPART 100 UNIT/ML ~~LOC~~ SOLN
0.0000 [IU] | Freq: Every day | SUBCUTANEOUS | Status: DC
  Administered 2020-06-20: 2 [IU] via SUBCUTANEOUS

## 2020-06-19 MED ORDER — INSULIN GLARGINE 100 UNIT/ML ~~LOC~~ SOLN
10.0000 [IU] | Freq: Every day | SUBCUTANEOUS | Status: DC
  Administered 2020-06-19 – 2020-06-21 (×3): 10 [IU] via SUBCUTANEOUS
  Filled 2020-06-19 (×3): qty 0.1

## 2020-06-19 MED ORDER — INSULIN ASPART 100 UNIT/ML ~~LOC~~ SOLN
0.0000 [IU] | Freq: Three times a day (TID) | SUBCUTANEOUS | Status: DC
  Administered 2020-06-19: 15 [IU] via SUBCUTANEOUS
  Administered 2020-06-20: 11 [IU] via SUBCUTANEOUS
  Administered 2020-06-20: 15 [IU] via SUBCUTANEOUS
  Administered 2020-06-20 – 2020-06-21 (×2): 4 [IU] via SUBCUTANEOUS
  Administered 2020-06-21: 20 [IU] via SUBCUTANEOUS
  Administered 2020-06-21 – 2020-06-22 (×2): 4 [IU] via SUBCUTANEOUS
  Administered 2020-06-22: 3 [IU] via SUBCUTANEOUS
  Administered 2020-06-22: 7 [IU] via SUBCUTANEOUS
  Administered 2020-06-23: 11 [IU] via SUBCUTANEOUS

## 2020-06-19 NOTE — Progress Notes (Addendum)
PROGRESS NOTE                                                                                                                                                                                                             Patient Demographics:    Nathaniel Daugherty, is a 44 y.o. male, DOB - 05-May-1976, SJG:[id number]  Outpatient Primary MD for the patient is Jarold Motto, [name]   Admit date - 06/14/2020   LOS - 5  Chief Complaint  Patient presents with  . Shortness of Breath       Brief Narrative: Patient is a 44 y.o. male with PMHx of HTN, OSA, obesity-who tested positive for COVID-19 (unvaccinated) on 8/1-presented to the hospital with worsening dyspnea, fatigue-found to have acute hypoxic respiratory failure secondary to COVID-19 pneumonia.  Significant Events: 8/4>> Admit to [facility] for worsening shortness of breath-found to have hypoxemia secondary to COVID-19.  Significant studies: 8/4>>Chest x-ray: Diffuse bilateral infiltrates.  COVID-19 medications: Steroids: 8/4>> Remdesivir: 8/4>>8/8 Actemra: 8/5 x 1  Antibiotics: None  Microbiology data: None  Procedures: None  Consults: None  DVT prophylaxis: Lovenox   Subjective:   Lying comfortably in bed-oxygen requirements are slowly coming down-he was down to 6-7 L of oxygen today.  Does acknowledge that he gets tired when he ambulates.   Assessment  & Plan :   Acute Hypoxic Resp Failure due to Covid 19 Viral pneumonia: Improving-FiO2 slowly decreasing-down to 6-7 L of oxygen this morning (at one point was on 15 L HFNC with NRB).  Has completed a course of remdesivir-remains on steroids-no evidence of volume overload-continue attempts to slowly titrate down FiO2.  Fever: afebrile O2 requirements:  SpO2: 92 % O2 Flow Rate (L/min): 5 L/min FiO2 (%): 100 %   COVID-19 Labs: Recent Labs    06/17/20 0501 06/18/20 0451 06/19/20 0437  DDIMER 0.31 0.41  0.51*  FERRITIN 873* 656* 640*  CRP 0.6 <0.5 <0.5    No results found for: BNP  Recent Labs  Lab 06/14/20 1626  PROCALCITON 0.37    Lab Results  Component Value Date   SARSCOV2NAA POSITIVE (A) 06/14/2020     Prone/Incentive Spirometry: encouraged patient to lie prone for 3-4 hours at a time for a total of 16 hours a day, and to encourage incentive spirometry use 3-4/hour  AKI: Likely hemodynamically mediated-improving-continue supportive care.  Hyponatremia: Appears to be mild-volume status stable-continue supportive care.  Prediabetes (A1c 6.4 on 06/19/2020) with steroid-induced hyperglycemia: CBGs remain significantly elevated-add 10 units of Lantus daily-change SSI to resistant scale-follow.  Recent Labs    06/18/20 2104 06/19/20 0744 06/19/20 1215  GLUCAP 369* 240* 316*    HTN: BP controlled-continue amlodipine.  OSA: Could not tolerate CPAP nightly-remains on HFNC  Morbid Obesity: Estimated body mass index is 35.36 kg/m as calculated from the following:   Height as of this encounter: 5\' 8"  (1.727 m).   Weight as of this encounter: 105.5 kg.   ABG: No results found for: PHART, PCO2ART, PO2ART, HCO3, TCO2, ACIDBASEDEF, O2SAT FiO2 (%):  [100 %] 100 %  Condition -  Guarded  Family Communication  : Left a voicemail for significant other ([id number]([phone]) on 8/9  Code Status :  Full Code  Diet :  Diet Order            Diet regular Room service appropriate? Yes; Fluid consistency: Thin  Diet effective now                  Disposition Plan  :   Status is: Inpatient  Remains inpatient appropriate because:Inpatient level of care appropriate due to severity of illness   Dispo: The patient is from: Home              Anticipated d/c is to: Home              Anticipated d/c date is: > 3 days              Patient currently is not medically stable to d/c.   Barriers to discharge: Severe Hypoxia requiring O2 supplementation/complete 5 days of IV  Remdesivir  Antimicorbials  :    Anti-infectives (From admission, onward)   Start     Dose/Rate Route Frequency Ordered Stop   06/15/20 1000  remdesivir 100 mg in sodium chloride 0.9 % 100 mL IVPB       "Followed by" Linked Group Details   100 mg 200 mL/hr over 30 Minutes Intravenous Daily 06/14/20 1654 06/18/20 1044   06/14/20 1800  remdesivir 200 mg in sodium chloride 0.9% 250 mL IVPB       "Followed by" Linked Group Details   200 mg 580 mL/hr over 30 Minutes Intravenous Once 06/14/20 1654 06/14/20 2043      Inpatient Medications  Scheduled Meds: . amLODipine  10 mg Oral Daily  . vitamin C  500 mg Oral Daily  . chlorpheniramine-HYDROcodone  5 mL Oral Q12H  . enoxaparin (LOVENOX) injection  50 mg Subcutaneous Q24H  . famotidine  20 mg Oral Daily  . insulin aspart  0-9 Units Subcutaneous TID WC  . methylPREDNISolone (SOLU-MEDROL) injection  60 mg Intravenous Q12H  . sodium chloride flush  3 mL Intravenous Q12H  . zinc sulfate  220 mg Oral Daily   Continuous Infusions: . sodium chloride     PRN Meds:.sodium chloride, acetaminophen, albuterol, bisacodyl, guaiFENesin-dextromethorphan, ondansetron **OR** ondansetron (ZOFRAN) IV, oxyCODONE, phenol, polyethylene glycol, sodium chloride flush, sodium phosphate   Time Spent in minutes  25  See all Orders from today for further details   Jeoffrey MassedShanker Randen Kauth M.D on 06/19/2020 at 1:44 PM  To page go to [email or web address] - use universal password  [facility] -  Office  [phone]    Objective:   Vitals:   06/18/20 1954 06/19/20 0527 06/19/20 0748 06/19/20 1239  BP: 131/90 (!) 141/99 (!) 147/98 (!) 137/91  Pulse: 95 77  89  Resp: (!) 22 (!) 21 17 19   Temp: 98.4 F (36.9 C) 97.8 F (36.6 C) 98.2 F (36.8 C) 97.9 F (36.6 C)  TempSrc: Oral Oral Oral Oral  SpO2: 95% 94% 93% 92%  Weight:      Height:        Wt Readings from Last 3 Encounters:  06/14/20 105.5 kg  01/03/20 105.5 kg  09/27/19 105.9 kg      Intake/Output Summary (Last 24 hours) at 06/19/2020 1344 Last data filed at 06/19/2020 1111 Gross per 24 hour  Intake 120 ml  Output 750 ml  Net -630 ml     Physical Exam Gen Exam:Alert awake-not in any distress HEENT:atraumatic, normocephalic Chest: B/L clear to auscultation anteriorly CVS:S1S2 regular Abdomen:soft non tender, non distended Extremities:no edema Neurology: Non focal Skin: no rash   Data Review:    CBC Recent Labs  Lab 06/14/20 1540 06/15/20 0439 06/17/20 0501 06/19/20 0437  WBC 4.9 2.7* 6.3 9.4  HGB 18.5* 17.1* 17.0 17.0  HCT 54.8* 49.8 49.8 50.4  PLT 210 201 262 285  MCV 88.7 89.2 89.2 91.1  MCH 29.9 30.6 30.5 30.7  MCHC 33.8 34.3 34.1 33.7  RDW 11.9 12.0 11.9 12.0  LYMPHSABS 1.6 0.6* 0.7  --   MONOABS 0.7 0.2 0.9  --   EOSABS 0.0 0.0 0.0  --   BASOSABS 0.0 0.0 0.0  --     Chemistries  Recent Labs  Lab 06/15/20 0439 06/16/20 0335 06/17/20 0501 06/18/20 0451 06/19/20 0437  NA 126* 129* 129* 130* 133*  K 4.7 4.6 4.6 4.9 4.9  CL 89* 92* 92* 93* 95*  CO2 25 27 27 29  33*  GLUCOSE 238* 178* 227* 274* 261*  BUN 25* 20 19 19  21*  CREATININE 1.58* 1.34* 1.30* 1.25* 1.33*  CALCIUM 8.2* 8.1* 8.1* 8.1* 8.2*  MG 2.1  --   --   --   --   AST 58* 39 24 20 19   ALT 32 30 26 23 27   ALKPHOS 67 63 63 61 61  BILITOT 1.0 0.9 0.9 0.9 0.9   ------------------------------------------------------------------------------------------------------------------ No results for input(s): CHOL, HDL, LDLCALC, TRIG, CHOLHDL, LDLDIRECT in the last 72 hours.  Lab Results  Component Value Date   HGBA1C 6.4 (H) 06/19/2020   ------------------------------------------------------------------------------------------------------------------ No results for input(s): TSH, T4TOTAL, T3FREE, THYROIDAB in the last 72 hours.  Invalid input(s): FREET3 ------------------------------------------------------------------------------------------------------------------ Recent  Labs    06/18/20 0451 06/19/20 0437  FERRITIN 656* 640*    Coagulation profile No results for input(s): INR, PROTIME in the last 168 hours.  Recent Labs    06/18/20 0451 06/19/20 0437  DDIMER 0.41 0.51*    Cardiac Enzymes No results for input(s): CKMB, TROPONINI, MYOGLOBIN in the last 168 hours.  Invalid input(s): CK ------------------------------------------------------------------------------------------------------------------ No results found for: BNP  Micro Results Recent Results (from the past 240 hour(s))  SARS Coronavirus 2 by RT PCR (hospital order, performed in [facility] hospital lab) Nasopharyngeal Nasopharyngeal Swab     Status: Abnormal   Collection Time: 06/14/20  3:03 PM   Specimen: Nasopharyngeal Swab  Result Value Ref Range Status   SARS Coronavirus 2 POSITIVE (A) NEGATIVE Final    Comment: RESULT CALLED TO, READ BACK BY AND VERIFIED WITH: W,VENEGAS @1727  06/14/20 EB (NOTE) SARS-CoV-2 target nucleic acids are DETECTED  SARS-CoV-2 RNA is generally detectable in upper respiratory specimens  during the acute phase of infection.  Positive results are indicative  of the presence of  the identified virus, but do not rule out bacterial infection or co-infection with other pathogens not detected by the test.  Clinical correlation with patient history and  other diagnostic information is necessary to determine patient infection status.  The expected result is negative.  Fact Sheet for Patients:   BoilerBrush.com.cy   Fact Sheet for Healthcare Providers:   https://pope.com/    This test is not yet approved or cleared by the [place] FDA and  has been authorized for detection and/or diagnosis of SARS-CoV-2 by FDA under an Emergency Use Authorization (EUA).  This EUA will remain in effect (meaning this test can be  used) for the duration of  the COVID-19 declaration under Section 564(b)(1) of the Act,  21 U.S.C. section 360-bbb-3(b)(1), unless the authorization is terminated or revoked sooner.  Performed at [facility], [address] [place], [place] [address]     Radiology Reports DG Chest Portable 1 View  Result Date: 06/14/2020 CLINICAL DATA:  Pt tested positive for Covid on Sunday, having increase SOB, fever and generalized body aches EXAM: PORTABLE CHEST 1 VIEW COMPARISON:  None. FINDINGS: The heart size and mediastinal contours are within normal limits. Low lung volumes. There are diffuse bilateral infiltrates. No pneumothorax or large pleural effusion. No acute finding in the visualized skeleton. IMPRESSION: Diffuse bilateral infiltrates concerning for multifocal pneumonia. Electronically Signed   By: Emmaline Kluver M.D.   On: 06/14/2020 14:10

## 2020-06-19 NOTE — Progress Notes (Signed)
Physical Therapy Treatment Patient Details Name: Nathaniel Daugherty MRN: [id number] DOB: April 25, 1976 Today's Date: 06/19/2020    History of Present Illness 44 year old male with past medical history of hypertension, obesity class III and obstructive sleep apnea.  Patient tested positive for COVID-19 August 1 his symptoms have been generalized fatigue, diarrhea and rapid and progressive dyspnea. Initially required up to on 15 L per HFNC plus non re-breather mask with continued hypoxic respiratory failure.    PT Comments    Pt was able to preform two bouts of short distance gait to the bathroom and back on 6 L O2 [place]. O2 sats dropped to the low 70s on 6L with DOE 3/4 and RR in the upper 30s and (+) dry, hacking cough.  He was unable to do more after that.  HEP printed and left in room with yellow t-band.  Plan to review these exercises next session.  PT will continue to follow acutely for safe mobility progression   Follow Up Recommendations  No PT follow up     Equipment Recommendations  Other (comment) (home O2)    Recommendations for Other Services       Precautions / Restrictions Precautions Precautions: Other (comment) Precaution Comments: monitor O2    Mobility  Bed Mobility Overal bed mobility: Modified Independent                Transfers Overall transfer level: Needs assistance Equipment used: None Transfers: Sit to/from Stand Sit to Stand: Supervision         General transfer comment: supervision for safety and line management.   Ambulation/Gait Ambulation/Gait assistance: Supervision Gait Distance (Feet): 15 Feet (x2) Assistive device: None Gait Pattern/deviations: Step-through pattern;Staggering right;Staggering left Gait velocity: decreased Gait velocity interpretation: 1.31 - 2.62 ft/sec, indicative of limited community ambulator General Gait Details: Pt with mildly staggering, slow, purposeful gait pattern   Stairs             Wheelchair  Mobility    Modified Rankin (Stroke Patients Only)       Balance Overall balance assessment: Mild deficits observed, not formally tested                                          Cognition Arousal/Alertness: Awake/alert Behavior During Therapy: Flat affect Overall Cognitive Status: Within Functional Limits for tasks assessed                                 General Comments: Man of few words, quiet and polite, reserved.      Exercises Other Exercises Other Exercises: reports compliance with breathing exercises, dropped off t-band and UE/LE HEP packet, but did not review.  Plan to review next session.     General Comments General comments (skin integrity, edema, etc.): Pt started on 4 L O2 [place] and was 88% at rest, increased to 6L O2  for gait and he still dropped to the low 70s with increased RR to the upper 30s, dry cough and took ~5 mins to recover breathing and RR to try walking again.  After two bouts of short distance gait in the room he was exhausted, returned to supine in the bed.       Pertinent Vitals/Pain Pain Assessment: No/denies pain    Home Living  Prior Function            PT Goals (current goals can now be found in the care plan section) Acute Rehab PT Goals Patient Stated Goal: breathe better Progress towards PT goals: Progressing toward goals    Frequency    Min 3X/week      PT Plan Current plan remains appropriate    Co-evaluation              AM-PAC PT "6 Clicks" Mobility   Outcome Measure  Help needed turning from your back to your side while in a flat bed without using bedrails?: None Help needed moving from lying on your back to sitting on the side of a flat bed without using bedrails?: None Help needed moving to and from a bed to a chair (including a wheelchair)?: None Help needed standing up from a chair using your arms (e.g., wheelchair or bedside chair)?: None Help  needed to walk in hospital room?: None Help needed climbing 3-5 steps with a railing? : A Little 6 Click Score: 23    End of Session Equipment Utilized During Treatment: Oxygen Activity Tolerance: Patient limited by fatigue Patient left: in bed;with call bell/phone within reach Nurse Communication: Mobility status PT Visit Diagnosis: Difficulty in walking, not elsewhere classified (R26.2)     Time: 4097-3532 PT Time Calculation (min) (ACUTE ONLY): 26 min  Charges:  $Gait Training: 8-22 mins $Therapeutic Activity: 8-22 mins                     Corinna Capra, PT, DPT  Acute Rehabilitation [phone] pager [phone]) [phone] office

## 2020-06-19 NOTE — Progress Notes (Signed)
Inpatient Diabetes Program Recommendations  AACE/ADA: New Consensus Statement on Inpatient Glycemic Control (2015)  Target Ranges:  Prepandial:   less than 140 mg/dL      Peak postprandial:   less than 180 mg/dL (1-2 hours)      Critically ill patients:  140 - 180 mg/dL   Lab Results  Component Value Date   GLUCAP 316 (H) 06/19/2020   HGBA1C 6.4 (H) 06/19/2020    Review of Glycemic Control Results for Nathaniel Daugherty, Nathaniel Daugherty (MRN [id number]) as of 06/19/2020 12:23  Ref. Range 06/18/2020 12:29 06/18/2020 17:37 06/18/2020 21:04 06/19/2020 07:44 06/19/2020 12:15  Glucose-Capillary Latest Ref Range: 70 - 99 mg/dL 440 (H) 347 (H) 425 (H) 240 (H) 316 (H)   Diabetes history: Prediabetes Outpatient Diabetes medications: None Current orders for Inpatient glycemic control: Novolog sensitive tid  Inpatient Diabetes Program Recommendations:   While on steroids: -Increase Novolog correction to resistant tid + hs 0-5 units -Add Novolog 5 units tid meal coverage if eats 50%' -Tradjenta 5 mg qd  Thank you, [name]. Chanika Byland, RN, MSN, CDE  Diabetes Coordinator Inpatient Glycemic Control Team Team Pager [phone] (8am-5pm) 06/19/2020 12:36 PM

## 2020-06-19 NOTE — Progress Notes (Signed)
Pt asked about cpap. Pt states he does not want it tonight, pt instructed to call if he changed his mind.

## 2020-06-20 LAB — COMPREHENSIVE METABOLIC PANEL
ALT: 43 U/L (ref 0–44)
AST: 31 U/L (ref 15–41)
Albumin: 2.9 g/dL — ABNORMAL LOW (ref 3.5–5.0)
Alkaline Phosphatase: 57 U/L (ref 38–126)
Anion gap: 10 (ref 5–15)
BUN: 20 mg/dL (ref 6–20)
CO2: 29 mmol/L (ref 22–32)
Calcium: 8.3 mg/dL — ABNORMAL LOW (ref 8.9–10.3)
Chloride: 94 mmol/L — ABNORMAL LOW (ref 98–111)
Creatinine, Ser: 1.22 mg/dL (ref 0.61–1.24)
GFR calc Af Amer: >60 mL/min (ref >60)
GFR calc non Af Amer: >60 mL/min (ref >60)
Glucose, Bld: 179 mg/dL — ABNORMAL HIGH (ref 70–99)
Potassium: 5.1 mmol/L (ref 3.5–5.1)
Sodium: 133 mmol/L — ABNORMAL LOW (ref 135–145)
Total Bilirubin: 1.3 mg/dL — ABNORMAL HIGH (ref 0.3–1.2)
Total Protein: 5.6 g/dL — ABNORMAL LOW (ref 6.5–8.1)

## 2020-06-20 LAB — CBC
HCT: 51.6 % (ref 39.0–52.0)
Hemoglobin: 17.3 g/dL — ABNORMAL HIGH (ref 13.0–17.0)
MCH: 30.2 pg (ref 26.0–34.0)
MCHC: 33.5 g/dL (ref 30.0–36.0)
MCV: 90.1 fL (ref 80.0–100.0)
Platelets: 304 10*3/uL (ref 150–400)
RBC: 5.73 MIL/uL (ref 4.22–5.81)
RDW: 11.9 % (ref 11.5–15.5)
WBC: 12.6 10*3/uL — ABNORMAL HIGH (ref 4.0–10.5)
nRBC: 0 % (ref 0.0–0.2)

## 2020-06-20 LAB — GLUCOSE, CAPILLARY
Glucose-Capillary: 165 mg/dL — ABNORMAL HIGH (ref 70–99)
Glucose-Capillary: 316 mg/dL — ABNORMAL HIGH (ref 70–99)
Glucose-Capillary: 262 mg/dL — ABNORMAL HIGH (ref 70–99)
Glucose-Capillary: 214 mg/dL — ABNORMAL HIGH (ref 70–99)

## 2020-06-20 LAB — D-DIMER, QUANTITATIVE: D-Dimer, Quant: 0.45 ug/mL-FEU (ref 0.00–0.50)

## 2020-06-20 LAB — C-REACTIVE PROTEIN: CRP: <0.5 mg/dL (ref <1.0)

## 2020-06-20 LAB — FERRITIN: Ferritin: 741 ng/mL — ABNORMAL HIGH (ref 24–336)

## 2020-06-20 MED ORDER — PREDNISONE 20 MG PO TABS
40.0000 mg | ORAL_TABLET | Freq: Every day | ORAL | Status: DC
  Administered 2020-06-21 – 2020-06-23 (×3): 40 mg via ORAL
  Filled 2020-06-20 (×3): qty 2

## 2020-06-20 MED ORDER — METHYLPREDNISOLONE SODIUM SUCC 40 MG IJ SOLR: 40.0000 mg | Freq: Two times a day (BID) | INTRAMUSCULAR | Status: DC

## 2020-06-20 NOTE — Progress Notes (Signed)
Patient placed on the CPAP with 7L O2. Pt is doing well and tolerating the CPAP. RT will continue to monitor.

## 2020-06-20 NOTE — Progress Notes (Signed)
SATURATION QUALIFICATIONS: (This note is used to comply with regulatory documentation for home oxygen)  Patient Saturations on Room Air at Rest = 80%  Patient Saturations on Room Air while Ambulating = 77%  Patient Saturations on 6 Liters of oxygen while Ambulating = 85%  Please briefly explain why patient needs home oxygen: Patient needs oxygen HFNC while ambulating. He was placed on 6 L HFNC while ambulating in his room with Sat O2 85%. At rest patient, sat O2 90 on 4 L HFNC.

## 2020-06-20 NOTE — Progress Notes (Signed)
Occupational Therapy Treatment Patient Details Name: Nathaniel Daugherty MRN: [id number] DOB: 23-Mar-1976 Today's Date: 06/20/2020    History of present illness 44 year old male with past medical history of hypertension, obesity class III and obstructive sleep apnea.  Patient tested positive for COVID-19 August 1 his symptoms have been generalized fatigue, diarrhea and rapid and progressive dyspnea. Initially required up to on 15 L per HFNC plus non re-breather mask with continued hypoxic respiratory failure.   OT comments  On entry, poor pleth using finger probe L finger. Pedi probe placed on R finger and able to achieve good pleth with SpO2 84 on 2L. O2 increased to 6L with eventual rebound to 88 sitting EOB. Pt states he gets SOB moving to the edge of bed. Required use of 8L to mobilize to bathroom, complete pericare, stand at sink for grooming and mobilize back to bed. SpO2 desat to 86 with 2/4 DOE on 8L. HR max 144. Pt returned to bed - Decreased O2 to 6L after mobility with SpO2 88 with good pleth. Discussed with nsg. Will continue to follow acutely. Do not anticipate need for OT after DC.   Follow Up Recommendations  No OT follow up;Supervision - Intermittent    Equipment Recommendations  3 in 1 bedside commode    Recommendations for Other Services      Precautions / Restrictions Precautions Precautions: Other (comment) (watch Os sats)       Mobility Bed Mobility Overal bed mobility: Modified Independent                Transfers Overall transfer level: Needs assistance   Transfers: Sit to/from Stand Sit to Stand: Supervision              Balance Overall balance assessment: Needs assistance   Sitting balance-Leahy Scale: Good       Standing balance-Leahy Scale: Fair                             ADL either performed or assessed with clinical judgement   ADL       Grooming: Set up;Supervision/safety;Standing                   Toilet  Transfer: Min guard;Ambulation   Toileting- Clothing Manipulation and Hygiene: Modified independent         General ADL Comments: Educated on use of pursed lip breathing during ADL tasks     Vision       Perception     Praxis      Cognition Arousal/Alertness: Awake/alert Behavior During Therapy: Flat affect Overall Cognitive Status: Within Functional Limits for tasks assessed                                          Exercises Exercises: Other exercises Other Exercises Other Exercises: Pt reports using T band   Shoulder Instructions       General Comments      Pertinent Vitals/ Pain       Pain Assessment: No/denies pain  Home Living                                          Prior Functioning/Environment  Frequency  Min 3X/week        Progress Toward Goals  OT Goals(current goals can now be found in the care plan section)  Progress towards OT goals: Progressing toward goals  Acute Rehab OT Goals Patient Stated Goal: breathe better OT Goal Formulation: With patient Time For Goal Achievement: 07/01/20 Potential to Achieve Goals: Good ADL Goals Pt Will Perform Lower Body Dressing: with supervision;sit to/from stand Pt Will Transfer to Toilet: with supervision;ambulating;regular height toilet;bedside commode Pt Will Perform Toileting - Clothing Manipulation and hygiene: with modified independence;sit to/from stand Additional ADL Goal #1: Pt to verbalize at least 3 energy conservation strategies to maximize ADL/IADL independence  Plan Discharge plan needs to be updated    Co-evaluation                 AM-PAC OT "6 Clicks" Daily Activity     Outcome Measure   Help from another person eating meals?: None Help from another person taking care of personal grooming?: A Little Help from another person toileting, which includes using toliet, bedpan, or urinal?: A Little Help from another person  bathing (including washing, rinsing, drying)?: A Little Help from another person to put on and taking off regular upper body clothing?: A Little Help from another person to put on and taking off regular lower body clothing?: A Little 6 Click Score: 19    End of Session Equipment Utilized During Treatment: Oxygen (8L during mobility)  OT Visit Diagnosis: Other (comment) (decreased cardiopulmonary tolerance)   Activity Tolerance Patient tolerated treatment well   Patient Left in bed;with call bell/phone within reach   Nurse Communication Mobility status;Other (comment) (O2 needs)        Time: 9563-8756 OT Time Calculation (min): 29 min  Charges: OT General Charges $OT Visit: 1 Visit OT Treatments $Self Care/Home Management : 23-37 mins  Luisa Dago, OT/L   Acute OT Clinical Specialist Acute Rehabilitation Services Pager [phone] Office [phone]    [facility] 06/20/2020, 5:08 PM

## 2020-06-20 NOTE — Progress Notes (Signed)
PROGRESS NOTE                                                                                                                                                                                                             Patient Demographics:    Nathaniel Daugherty, is a 44 y.o. male, DOB - Mar 27, 1976, ZOX:[id number]  Outpatient Primary MD for the patient is Jarold Motto, [name]   Admit date - 06/14/2020   LOS - 6  Chief Complaint  Patient presents with  . Shortness of Breath       Brief Narrative: Patient is a 44 y.o. male with PMHx of HTN, OSA, obesity-who tested positive for COVID-19 (unvaccinated) on 8/1-presented to the hospital with worsening dyspnea, fatigue-found to have severe acute hypoxic respiratory failure secondary to COVID-19 pneumonia requiring high flow O2.  Hospital course prolonged-due to persistent severe hypoxemia-treated with steroids/remdesivir/Actemra-with gradual improvement over the past few days.  See below for further details.  Significant Events: 8/4>> Admit to [facility] for worsening shortness of breath-found to have severe hypoxemia secondary to COVID-19 requiring high flow oxygen..  Significant studies: 8/4>>Chest x-ray: Diffuse bilateral infiltrates.  COVID-19 medications: Steroids: 8/4>> Remdesivir: 8/4>>8/8 Actemra: 8/5 x 1  Antibiotics: None  Microbiology data: None  Procedures: None  Consults: None  DVT prophylaxis: Lovenox   Subjective:   Feels better-rapidly improving over the past few days-down to just 2 L of oxygen.   Assessment  & Plan :   Acute Hypoxic Resp Failure due to Covid 19 Viral pneumonia: Improving rapidly-I titrated down to 2 L of oxygen this morning (at one point was on 15 L HFNC with NRB)-but continues to have some amount of exertional dyspnea.  Continue tapering steroids.  Plan is to mobilize-and see how he does-probably will require home O2.  Continue  supportive care.  Assess for home O2 requirement prior to discharge ( ambulatory desaturation screen ordered)   Fever: afebrile O2 requirements:  SpO2: 93 % O2 Flow Rate (L/min): 2 L/min FiO2 (%): 100 %   COVID-19 Labs: Recent Labs    06/18/20 0451 06/19/20 0437 06/20/20 0500  DDIMER 0.41 0.51* 0.45  FERRITIN 656* 640* 741*  CRP <0.5 <0.5 <0.5    No results found for: BNP  Recent Labs  Lab 06/14/20 1626  PROCALCITON 0.37    Lab Results  Component Value  Date   SARSCOV2NAA POSITIVE (A) 06/14/2020     Prone/Incentive Spirometry: encouraged patient to lie prone for 3-4 hours at a time for a total of 16 hours a day, and to encourage incentive spirometry use 3-4/hour  AKI: Likely hemodynamically mediated-improving-continue supportive care.  Hyponatremia: Appears to be mild-volume status stable-continue supportive care.  Prediabetes (A1c 6.4 on 06/19/2020) with steroid-induced hyperglycemia: CBGs remain significantly elevated-but should improve over the next few days as steroids have been tapered down.  Continue Lantus and SSI-follow closely-we will not make any changes today-reassess tomorrow.    Recent Labs    06/19/20 2020 06/20/20 0743 06/20/20 1142  GLUCAP 190* 165* 316*    HTN: BP controlled-continue amlodipine.  OSA: Could not tolerate CPAP nightly over the past few days as patient was on HFNC-have asked patient to try CPAP tonight.  Morbid Obesity: Estimated body mass index is 35.36 kg/m as calculated from the following:   Height as of this encounter: 5\' 8"  (1.727 m).   Weight as of this encounter: 105.5 kg.   ABG: No results found for: PHART, PCO2ART, PO2ART, HCO3, TCO2, ACIDBASEDEF, O2SAT    Condition -  Guarded  Family Communication  : Significant other ([phone]([phone]) on 8/10  Code Status :  Full Code  Diet :  Diet Order            Diet regular Room service appropriate? Yes; Fluid consistency: Thin  Diet effective now                    Disposition Plan  :   Status is: Inpatient  Remains inpatient appropriate because:Inpatient level of care appropriate due to severity of illness   Dispo: The patient is from: Home              Anticipated d/c is to: Home              Anticipated d/c date is: > 3 days              Patient currently is not medically stable to d/c.   Barriers to discharge: Severe Hypoxia requiring O2 supplementation  Antimicorbials  :    Anti-infectives (From admission, onward)   Start     Dose/Rate Route Frequency Ordered Stop   06/15/20 1000  remdesivir 100 mg in sodium chloride 0.9 % 100 mL IVPB       "Followed by" Linked Group Details   100 mg 200 mL/hr over 30 Minutes Intravenous Daily 06/14/20 1654 06/18/20 1044   06/14/20 1800  remdesivir 200 mg in sodium chloride 0.9% 250 mL IVPB       "Followed by" Linked Group Details   200 mg 580 mL/hr over 30 Minutes Intravenous Once 06/14/20 1654 06/14/20 2043      Inpatient Medications  Scheduled Meds: . amLODipine  10 mg Oral Daily  . vitamin C  500 mg Oral Daily  . chlorpheniramine-HYDROcodone  5 mL Oral Q12H  . enoxaparin (LOVENOX) injection  50 mg Subcutaneous Q24H  . famotidine  20 mg Oral Daily  . insulin aspart  0-20 Units Subcutaneous TID WC  . insulin aspart  0-5 Units Subcutaneous QHS  . insulin glargine  10 Units Subcutaneous Daily  . methylPREDNISolone (SOLU-MEDROL) injection  40 mg Intravenous Q12H  . sodium chloride flush  3 mL Intravenous Q12H  . zinc sulfate  220 mg Oral Daily   Continuous Infusions: . sodium chloride     PRN Meds:.sodium chloride, acetaminophen, albuterol, bisacodyl, guaiFENesin-dextromethorphan, ondansetron **  OR** ondansetron (ZOFRAN) IV, oxyCODONE, phenol, polyethylene glycol, sodium chloride flush, sodium phosphate   Time Spent in minutes  25  See all Orders from today for further details   Jeoffrey Massed M.D on 06/20/2020 at 3:29 PM  To page go to [email or web address] - use universal  password  [facility] -  Office  [phone]    Objective:   Vitals:   06/20/20 1247 06/20/20 1249 06/20/20 1251 06/20/20 1256  BP: 127/89 127/89 (!) 127/98   Pulse: (!) 110 (!) 108 (!) 107 70  Resp: 14 10 14 20   Temp: 98.2 F (36.8 C) 98.7 F (37.1 C) 98.4 F (36.9 C)   TempSrc: Oral Oral Oral   SpO2: 91% 91% 91% 93%  Weight:      Height:        Wt Readings from Last 3 Encounters:  06/14/20 105.5 kg  01/03/20 105.5 kg  09/27/19 105.9 kg     Intake/Output Summary (Last 24 hours) at 06/20/2020 1529 Last data filed at 06/20/2020 08/20/2020 Gross per 24 hour  Intake 123 ml  Output 425 ml  Net -302 ml     Physical Exam Gen Exam:Alert awake-not in any distress HEENT:atraumatic, normocephalic Chest: B/L clear to auscultation anteriorly CVS:S1S2 regular Abdomen:soft non tender, non distended Extremities:no edema Neurology: Non focal Skin: no rash   Data Review:    CBC Recent Labs  Lab 06/14/20 1540 06/15/20 0439 06/17/20 0501 06/19/20 0437 06/20/20 0500  WBC 4.9 2.7* 6.3 9.4 12.6*  HGB 18.5* 17.1* 17.0 17.0 17.3*  HCT 54.8* 49.8 49.8 50.4 51.6  PLT 210 201 262 285 304  MCV 88.7 89.2 89.2 91.1 90.1  MCH 29.9 30.6 30.5 30.7 30.2  MCHC 33.8 34.3 34.1 33.7 33.5  RDW 11.9 12.0 11.9 12.0 11.9  LYMPHSABS 1.6 0.6* 0.7  --   --   MONOABS 0.7 0.2 0.9  --   --   EOSABS 0.0 0.0 0.0  --   --   BASOSABS 0.0 0.0 0.0  --   --     Chemistries  Recent Labs  Lab 06/15/20 0439 06/15/20 0439 06/16/20 0335 06/17/20 0501 06/18/20 0451 06/19/20 0437 06/20/20 0500  NA 126*   < > 129* 129* 130* 133* 133*  K 4.7   < > 4.6 4.6 4.9 4.9 5.1  CL 89*   < > 92* 92* 93* 95* 94*  CO2 25   < > 27 27 29  33* 29  GLUCOSE 238*   < > 178* 227* 274* 261* 179*  BUN 25*   < > 20 19 19  21* 20  CREATININE 1.58*   < > 1.34* 1.30* 1.25* 1.33* 1.22  CALCIUM 8.2*   < > 8.1* 8.1* 8.1* 8.2* 8.3*  MG 2.1  --   --   --   --   --   --   AST 58*   < > 39 24 20 19 31   ALT 32   < > 30 26  23 27  43  ALKPHOS 67   < > 63 63 61 61 57  BILITOT 1.0   < > 0.9 0.9 0.9 0.9 1.3*   < > = values in this interval not displayed.   ------------------------------------------------------------------------------------------------------------------ No results for input(s): CHOL, HDL, LDLCALC, TRIG, CHOLHDL, LDLDIRECT in the last 72 hours.  Lab Results  Component Value Date   HGBA1C 6.4 (H) 06/19/2020   ------------------------------------------------------------------------------------------------------------------ No results for input(s): TSH, T4TOTAL, T3FREE, THYROIDAB in the last 72 hours.  Invalid input(s): FREET3 ------------------------------------------------------------------------------------------------------------------  Recent Labs    06/19/20 0437 06/20/20 0500  FERRITIN 640* 741*    Coagulation profile No results for input(s): INR, PROTIME in the last 168 hours.  Recent Labs    06/19/20 0437 06/20/20 0500  DDIMER 0.51* 0.45    Cardiac Enzymes No results for input(s): CKMB, TROPONINI, MYOGLOBIN in the last 168 hours.  Invalid input(s): CK ------------------------------------------------------------------------------------------------------------------ No results found for: BNP  Micro Results Recent Results (from the past 240 hour(s))  SARS Coronavirus 2 by RT PCR (hospital order, performed in [facility] hospital lab) Nasopharyngeal Nasopharyngeal Swab     Status: Abnormal   Collection Time: 06/14/20  3:03 PM   Specimen: Nasopharyngeal Swab  Result Value Ref Range Status   SARS Coronavirus 2 POSITIVE (A) NEGATIVE Final    Comment: RESULT CALLED TO, READ BACK BY AND VERIFIED WITH: W,VENEGAS @1727  06/14/20 EB (NOTE) SARS-CoV-2 target nucleic acids are DETECTED  SARS-CoV-2 RNA is generally detectable in upper respiratory specimens  during the acute phase of infection.  Positive results are indicative  of the presence of the identified virus, but do not rule  out bacterial infection or co-infection with other pathogens not detected by the test.  Clinical correlation with patient history and  other diagnostic information is necessary to determine patient infection status.  The expected result is negative.  Fact Sheet for Patients:   08/14/20   Fact Sheet for Healthcare Providers:   BoilerBrush.com.cy    This test is not yet approved or cleared by the https://pope.com/ FDA and  has been authorized for detection and/or diagnosis of SARS-CoV-2 by FDA under an Emergency Use Authorization (EUA).  This EUA will remain in effect (meaning this test can be  used) for the duration of  the COVID-19 declaration under Section 564(b)(1) of the Act, 21 U.S.C. section 360-bbb-3(b)(1), unless the authorization is terminated or revoked sooner.  Performed at [facility], [address], [place], [place]     Radiology Reports DG Chest Portable 1 View  Result Date: 06/14/2020 CLINICAL DATA:  Pt tested positive for Covid on Sunday, having increase SOB, fever and generalized body aches EXAM: PORTABLE CHEST 1 VIEW COMPARISON:  None. FINDINGS: The heart size and mediastinal contours are within normal limits. Low lung volumes. There are diffuse bilateral infiltrates. No pneumothorax or large pleural effusion. No acute finding in the visualized skeleton. IMPRESSION: Diffuse bilateral infiltrates concerning for multifocal pneumonia. Electronically Signed   By: Wednesday M.D.   On: 06/14/2020 14:10

## 2020-06-21 LAB — CBC
HCT: 50 % (ref 39.0–52.0)
Hemoglobin: 16.9 g/dL (ref 13.0–17.0)
MCH: 30.7 pg (ref 26.0–34.0)
MCHC: 33.8 g/dL (ref 30.0–36.0)
MCV: 90.7 fL (ref 80.0–100.0)
Platelets: 343 10*3/uL (ref 150–400)
RBC: 5.51 MIL/uL (ref 4.22–5.81)
RDW: 12 % (ref 11.5–15.5)
WBC: 12.7 10*3/uL — ABNORMAL HIGH (ref 4.0–10.5)
nRBC: 0 % (ref 0.0–0.2)

## 2020-06-21 LAB — COMPREHENSIVE METABOLIC PANEL
ALT: 60 U/L — ABNORMAL HIGH (ref 0–44)
AST: 44 U/L — ABNORMAL HIGH (ref 15–41)
Albumin: 2.8 g/dL — ABNORMAL LOW (ref 3.5–5.0)
Alkaline Phosphatase: 53 U/L (ref 38–126)
Anion gap: 9 (ref 5–15)
BUN: 24 mg/dL — ABNORMAL HIGH (ref 6–20)
CO2: 29 mmol/L (ref 22–32)
Calcium: 8.1 mg/dL — ABNORMAL LOW (ref 8.9–10.3)
Chloride: 97 mmol/L — ABNORMAL LOW (ref 98–111)
Creatinine, Ser: 1.3 mg/dL — ABNORMAL HIGH (ref 0.61–1.24)
GFR calc Af Amer: >60 mL/min (ref >60)
GFR calc non Af Amer: >60 mL/min (ref >60)
Glucose, Bld: 136 mg/dL — ABNORMAL HIGH (ref 70–99)
Potassium: 4.3 mmol/L (ref 3.5–5.1)
Sodium: 135 mmol/L (ref 135–145)
Total Bilirubin: 0.5 mg/dL (ref 0.3–1.2)
Total Protein: 5.3 g/dL — ABNORMAL LOW (ref 6.5–8.1)

## 2020-06-21 LAB — GLUCOSE, CAPILLARY
Glucose-Capillary: 159 mg/dL — ABNORMAL HIGH (ref 70–99)
Glucose-Capillary: 155 mg/dL — ABNORMAL HIGH (ref 70–99)
Glucose-Capillary: 409 mg/dL — ABNORMAL HIGH (ref 70–99)
Glucose-Capillary: 197 mg/dL — ABNORMAL HIGH (ref 70–99)

## 2020-06-21 LAB — D-DIMER, QUANTITATIVE: D-Dimer, Quant: 0.51 ug/mL-FEU — ABNORMAL HIGH (ref 0.00–0.50)

## 2020-06-21 LAB — FERRITIN: Ferritin: 715 ng/mL — ABNORMAL HIGH (ref 24–336)

## 2020-06-21 LAB — C-REACTIVE PROTEIN: CRP: 0.6 mg/dL (ref <1.0)

## 2020-06-21 MED ORDER — INSULIN ASPART 100 UNIT/ML ~~LOC~~ SOLN
4.0000 [IU] | Freq: Three times a day (TID) | SUBCUTANEOUS | Status: DC
  Administered 2020-06-21 – 2020-06-23 (×5): 4 [IU] via SUBCUTANEOUS

## 2020-06-21 MED ORDER — OXYMETAZOLINE HCL 0.05 % NA SOLN
2.0000 | Freq: Two times a day (BID) | NASAL | Status: DC | PRN
  Filled 2020-06-21: qty 30

## 2020-06-21 MED ORDER — FUROSEMIDE 10 MG/ML IJ SOLN
40.0000 mg | Freq: Once | INTRAMUSCULAR | Status: AC
  Administered 2020-06-21: 40 mg via INTRAVENOUS
  Filled 2020-06-21: qty 4

## 2020-06-21 MED ORDER — INSULIN GLARGINE 100 UNIT/ML ~~LOC~~ SOLN
10.0000 [IU] | Freq: Two times a day (BID) | SUBCUTANEOUS | Status: DC
  Administered 2020-06-21 – 2020-06-23 (×4): 10 [IU] via SUBCUTANEOUS
  Filled 2020-06-21 (×5): qty 0.1

## 2020-06-21 NOTE — Progress Notes (Signed)
Pt reports new onset nose bleed; RT stated ask MD about non rebreather at 15L vs. HFNC to alleviate nose bleeds.  Paged MD.

## 2020-06-21 NOTE — Progress Notes (Signed)
Physical Therapy Treatment Patient Details Name: Nathaniel Daugherty MRN: [id number] DOB: 13-Nov-1975 Today's Date: 06/21/2020    History of Present Illness Pt is 44 year old male with past medical history of hypertension, obesity class III and obstructive sleep apnea.  Patient tested positive for COVID-19 August 1 his symptoms have been generalized fatigue, diarrhea and rapid and progressive dyspnea. Initially required up to on 15 L per HFNC plus non re-breather mask with continued hypoxic respiratory failure.    PT Comments    Pt on increased O2 today compared to last treatment, but tolerated increased activity with less fatigue.  Pt's HR did elevate to 146 bpm with walking and required rest breaks.  Cont POC - required skilled therapy for vital monitoring and progressing.  Pt reports he is moving bed to chair independently.     Follow Up Recommendations  No PT follow up     Equipment Recommendations  Other (comment) (home O2)    Recommendations for Other Services       Precautions / Restrictions Precautions Precautions: Other (comment) Precaution Comments: monitor O2 and HR    Mobility  Bed Mobility               General bed mobility comments: in chair at arrival  Transfers Overall transfer level: Needs assistance Equipment used: None Transfers: Sit to/from Stand Sit to Stand: Supervision         General transfer comment: supervision for safety and line management ; sit to stand x 5  Ambulation/Gait Ambulation/Gait assistance: Supervision Gait Distance (Feet): 40 Feet (20', then 40'x3) Assistive device: None Gait Pattern/deviations: Step-through pattern Gait velocity: decreased   General Gait Details: Steady gait; no LOB; see vitals below; Required seated rest breaks   Stairs             Wheelchair Mobility    Modified Rankin (Stroke Patients Only)       Balance Overall balance assessment: Needs assistance Sitting-balance support: Feet  supported;No upper extremity supported Sitting balance-Leahy Scale: Good     Standing balance support: No upper extremity supported Standing balance-Leahy Scale: Good                              Cognition Arousal/Alertness: Awake/alert Behavior During Therapy: WFL for tasks assessed/performed Overall Cognitive Status: Within Functional Limits for tasks assessed                                        Exercises      General Comments General comments (skin integrity, edema, etc.): Pt on 15 L HFNC with sats 92% and HR 117 bpm rest, dropped to 87% ambulation but HR up to 146 bpm.  Rest breaks to allow pt to recover and HR to recover.      Pertinent Vitals/Pain Pain Assessment: No/denies pain    Home Living                      Prior Function            PT Goals (current goals can now be found in the care plan section) Acute Rehab PT Goals Patient Stated Goal: breathe better PT Goal Formulation: With patient Time For Goal Achievement: 07/01/20 Potential to Achieve Goals: Good Progress towards PT goals: Progressing toward goals    Frequency    Min 3X/week  PT Plan Current plan remains appropriate    Co-evaluation              AM-PAC PT "6 Clicks" Mobility   Outcome Measure  Help needed turning from your back to your side while in a flat bed without using bedrails?: None Help needed moving from lying on your back to sitting on the side of a flat bed without using bedrails?: None Help needed moving to and from a bed to a chair (including a wheelchair)?: None Help needed standing up from a chair using your arms (e.g., wheelchair or bedside chair)?: None Help needed to walk in hospital room?: None Help needed climbing 3-5 steps with a railing? : A Little 6 Click Score: 23    End of Session Equipment Utilized During Treatment: Oxygen Activity Tolerance: Patient tolerated treatment well Patient left: with call  bell/phone within reach;in chair Nurse Communication: Mobility status PT Visit Diagnosis: Difficulty in walking, not elsewhere classified (R26.2)     Time: 7353-2992 PT Time Calculation (min) (ACUTE ONLY): 18 min  Charges:  $Gait Training: 8-22 mins                     Anise Salvo, PT Acute Rehab Services Pager [phone] [facility] [phone]     Rayetta Humphrey 06/21/2020, 5:02 PM

## 2020-06-21 NOTE — Progress Notes (Addendum)
CCMD called to let me know patient SpO2% at 86; increased HF oxygen to 15L; notified RT and documented in the VS line item.   Patient at 640 took off CPAP and placed patient on HF at 13 L and saturation increased to 94% - notified RT that pt prefers different CPAP (auto) for HS.

## 2020-06-21 NOTE — Progress Notes (Signed)
RT went in the room to check on patient to see when he would like to go on the CPAP he refused to do it tonight. RN told me that his nose was bleeding little bit but its better now. RT will continue to monitor.

## 2020-06-21 NOTE — Progress Notes (Signed)
Nathaniel Daugherty  LOV:[id number] DOB: 05/18/1976 DOA: 06/14/2020 PCP: Jarold Motto, PA    Brief Narrative:  44 year old with a history of HTN, OSA, and obesity who tested positive for COVID-19 8/1 and presented to the hospital with worsening dyspnea and fatigue.  He was found to be in severe acute hypoxic respiratory failure due to Covid pneumonia and required high flow O2 support at presentation.  His hospital course has been prolonged due to severe persisting hypoxia.  He has required treatment with remdesivir, Actemra, and steroids.  Significant Events:  8/4 admit via [place]  Date of Positive COVID Test:  8/1  Vaccination Status: Unvaccinated  COVID-19 specific Treatment: Actemra 8/5 Steroid 8/4 > Remdesivir 8/4 > 8/8  Antimicrobials:  None  DVT prophylaxis: Lovenox  Subjective: Ambulatory saturation screen notes patient only able to saturate as high as 85% on 6 L of oxygen when ambulating.  Has required increasing levels of oxygen support over the last 12-24 hours.  Patient is frustrated and anxious to get home.  Denies chest pain nausea vomiting or abdominal pain.  Assessment & Plan:  COVID Pneumonia -severe persistent acute hypoxic respiratory failure Has begun to display rapid improvement over the last 24/48 hours but significant exertional dyspnea persists -has completed Actemra and Remdesivir -steroids being weaned - cont to wean O2 as able - counseled pt on ongoing use of IS  Acute kidney injury Hemodynamically mediated/prerenal -improving  Hyponatremia Likely simply related to volume depletion -resolved  Prediabetes/steroid-induced hyperglycemia A1c 6.4 06/19/2020-CBG quite variable presently -wean steroids and adjust insulin as needed  HTN Reasonably well-controlled at present  OSA Patient is having difficulty tolerating the hospital CPAP machine  Morbid obesity - Body mass index is 35.36 kg/m.  Code Status: FULL CODE Family Communication:    Status is: Inpatient  Remains inpatient appropriate because:Inpatient level of care appropriate due to severity of illness   Dispo: The patient is from: Home              Anticipated d/c is to: Home              Anticipated d/c date is: 3 days              Patient currently is not medically stable to d/c.  Consultants:  none  Objective: Blood pressure (!) 126/99, pulse 85, temperature 97.9 F (36.6 C), temperature source Oral, resp. rate 18, height 5\' 8"  (1.727 m), weight 105.5 kg, SpO2 (!) 88 %.  Intake/Output Summary (Last 24 hours) at 06/21/2020 0955 Last data filed at 06/21/2020 0900 Gross per 24 hour  Intake 480 ml  Output 300 ml  Net 180 ml   Filed Weights   06/14/20 1315  Weight: 105.5 kg    Examination: General: No acute respiratory distress Lungs: Fine diffuse crackles without wheezing Cardiovascular: Regular rate and rhythm without murmur gallop or rub normal S1 and S2 Abdomen: Nontender, nondistended, soft, bowel sounds positive, no rebound, no ascites, no appreciable mass Extremities: No significant cyanosis, clubbing, or edema bilateral lower extremities  CBC: Recent Labs  Lab 06/14/20 1540 06/14/20 1540 06/15/20 0439 06/15/20 0439 06/17/20 0501 06/17/20 0501 06/19/20 0437 06/20/20 0500 06/21/20 0109  WBC 4.9   < > 2.7*   < > 6.3   < > 9.4 12.6* 12.7*  NEUTROABS 2.7  --  1.9  --  4.6  --   --   --   --   HGB 18.5*   < > 17.1*   < > 17.0   < >  17.0 17.3* 16.9  HCT 54.8*   < > 49.8   < > 49.8   < > 50.4 51.6 50.0  MCV 88.7   < > 89.2   < > 89.2   < > 91.1 90.1 90.7  PLT 210   < > 201   < > 262   < > 285 304 343   < > = values in this interval not displayed.   Basic Metabolic Panel: Recent Labs  Lab 06/15/20 0439 06/16/20 0335 06/19/20 0437 06/20/20 0500 06/21/20 0109  NA 126*   < > 133* 133* 135  K 4.7   < > 4.9 5.1 4.3  CL 89*   < > 95* 94* 97*  CO2 25   < > 33* 29 29  GLUCOSE 238*   < > 261* 179* 136*  BUN 25*   < > 21* 20 24*   CREATININE 1.58*   < > 1.33* 1.22 1.30*  CALCIUM 8.2*   < > 8.2* 8.3* 8.1*  MG 2.1  --   --   --   --   PHOS 2.7  --   --   --   --    < > = values in this interval not displayed.   GFR: Estimated Creatinine Clearance: 88 mL/min (A) (by C-G formula based on SCr of 1.3 mg/dL (H)).  Liver Function Tests: Recent Labs  Lab 06/18/20 0451 06/19/20 0437 06/20/20 0500 06/21/20 0109  AST 20 19 31  44*  ALT 23 27 43 60*  ALKPHOS 61 61 57 53  BILITOT 0.9 0.9 1.3* 0.5  PROT 5.8* 5.9* 5.6* 5.3*  ALBUMIN 2.9* 2.9* 2.9* 2.8*   Recent Labs  Lab 06/14/20 1540  LIPASE 58*   No results for input(s): AMMONIA in the last 168 hours.  Coagulation Profile: No results for input(s): INR, PROTIME in the last 168 hours.  Cardiac Enzymes: Recent Labs  Lab 06/14/20 1540 06/16/20 0335  CKTOTAL 1,189* 282    HbA1C: Hgb A1c MFr Bld  Date/Time Value Ref Range Status  06/19/2020 04:37 AM 6.4 (H) 4.8 - 5.6 % Final    Comment:    (NOTE) Pre diabetes:          5.7%-6.4%  Diabetes:              >6.4%  Glycemic control for   <7.0% adults with diabetes   09/27/2019 03:19 PM 6.1 4.6 - 6.5 % Final    Comment:    Glycemic Control Guidelines for People with Diabetes:Non Diabetic:  <6%Goal of Therapy: <7%Additional Action Suggested:  >8%     CBG: Recent Labs  Lab 06/20/20 0743 06/20/20 1142 06/20/20 1652 06/20/20 2115 06/21/20 0845  GLUCAP 165* 316* 262* 214* 159*    Recent Results (from the past 240 hour(s))  SARS Coronavirus 2 by RT PCR (hospital order, performed in [facility] hospital lab) Nasopharyngeal Nasopharyngeal Swab     Status: Abnormal   Collection Time: 06/14/20  3:03 PM   Specimen: Nasopharyngeal Swab  Result Value Ref Range Status   SARS Coronavirus 2 POSITIVE (A) NEGATIVE Final    Comment: RESULT CALLED TO, READ BACK BY AND VERIFIED WITH: W,VENEGAS @1727  06/14/20 EB (NOTE) SARS-CoV-2 target nucleic acids are DETECTED  SARS-CoV-2 RNA is generally detectable in upper  respiratory specimens  during the acute phase of infection.  Positive results are indicative  of the presence of the identified virus, but do not rule out bacterial infection or co-infection with other pathogens not  detected by the test.  Clinical correlation with patient history and  other diagnostic information is necessary to determine patient infection status.  The expected result is negative.  Fact Sheet for Patients:   BoilerBrush.com.cy   Fact Sheet for Healthcare Providers:   https://pope.com/    This test is not yet approved or cleared by the [place] FDA and  has been authorized for detection and/or diagnosis of SARS-CoV-2 by FDA under an Emergency Use Authorization (EUA).  This EUA will remain in effect (meaning this test can be  used) for the duration of  the COVID-19 declaration under Section 564(b)(1) of the Act, 21 U.S.C. section 360-bbb-3(b)(1), unless the authorization is terminated or revoked sooner.  Performed at [facility], [address], [place], [place] [address]      Scheduled Meds:  amLODipine  10 mg Oral Daily   vitamin C  500 mg Oral Daily   chlorpheniramine-HYDROcodone  5 mL Oral Q12H   enoxaparin (LOVENOX) injection  50 mg Subcutaneous Q24H   famotidine  20 mg Oral Daily   insulin aspart  0-20 Units Subcutaneous TID WC   insulin aspart  0-5 Units Subcutaneous QHS   insulin glargine  10 Units Subcutaneous Daily   predniSONE  40 mg Oral Q breakfast   sodium chloride flush  3 mL Intravenous Q12H   zinc sulfate  220 mg Oral Daily   Continuous Infusions:  sodium chloride       LOS: 7 days   Lonia Blood, MD [facility] Office  [phone] Pager - Text Page per Loretha Stapler  If 7PM-7AM, please contact night-coverage per Amion 06/21/2020, 9:55 AM

## 2020-06-21 NOTE — Progress Notes (Signed)
Pt placed on cpap w/ Oxygen by RT. Pt saturations began dropping to 83%.  Increased HFNC to 13 LPM, notified RT who came back to check CPAP and settings.

## 2020-06-22 LAB — C-REACTIVE PROTEIN: CRP: 0.5 mg/dL (ref <1.0)

## 2020-06-22 LAB — GLUCOSE, CAPILLARY
Glucose-Capillary: 198 mg/dL — ABNORMAL HIGH (ref 70–99)
Glucose-Capillary: 131 mg/dL — ABNORMAL HIGH (ref 70–99)
Glucose-Capillary: 239 mg/dL — ABNORMAL HIGH (ref 70–99)
Glucose-Capillary: 116 mg/dL — ABNORMAL HIGH (ref 70–99)

## 2020-06-22 LAB — D-DIMER, QUANTITATIVE: D-Dimer, Quant: 0.51 ug/mL-FEU — ABNORMAL HIGH (ref 0.00–0.50)

## 2020-06-22 LAB — COMPREHENSIVE METABOLIC PANEL
ALT: 103 U/L — ABNORMAL HIGH (ref 0–44)
AST: 42 U/L — ABNORMAL HIGH (ref 15–41)
Albumin: 3.2 g/dL — ABNORMAL LOW (ref 3.5–5.0)
Alkaline Phosphatase: 54 U/L (ref 38–126)
Anion gap: 9 (ref 5–15)
BUN: 22 mg/dL — ABNORMAL HIGH (ref 6–20)
CO2: 32 mmol/L (ref 22–32)
Calcium: 8.3 mg/dL — ABNORMAL LOW (ref 8.9–10.3)
Chloride: 95 mmol/L — ABNORMAL LOW (ref 98–111)
Creatinine, Ser: 1.34 mg/dL — ABNORMAL HIGH (ref 0.61–1.24)
GFR calc Af Amer: >60 mL/min (ref >60)
GFR calc non Af Amer: >60 mL/min (ref >60)
Glucose, Bld: 172 mg/dL — ABNORMAL HIGH (ref 70–99)
Potassium: 4.4 mmol/L (ref 3.5–5.1)
Sodium: 136 mmol/L (ref 135–145)
Total Bilirubin: 1.5 mg/dL — ABNORMAL HIGH (ref 0.3–1.2)
Total Protein: 6 g/dL — ABNORMAL LOW (ref 6.5–8.1)

## 2020-06-22 LAB — CBC
HCT: 56.4 % — ABNORMAL HIGH (ref 39.0–52.0)
Hemoglobin: 18.8 g/dL — ABNORMAL HIGH (ref 13.0–17.0)
MCH: 30.6 pg (ref 26.0–34.0)
MCHC: 33.3 g/dL (ref 30.0–36.0)
MCV: 91.7 fL (ref 80.0–100.0)
Platelets: 362 10*3/uL (ref 150–400)
RBC: 6.15 MIL/uL — ABNORMAL HIGH (ref 4.22–5.81)
RDW: 12.3 % (ref 11.5–15.5)
WBC: 8.7 10*3/uL (ref 4.0–10.5)
nRBC: 0 % (ref 0.0–0.2)

## 2020-06-22 LAB — FERRITIN: Ferritin: 676 ng/mL — ABNORMAL HIGH (ref 24–336)

## 2020-06-22 NOTE — Progress Notes (Signed)
   06/21/20 2330  Assess: MEWS Score  Temp 97.6 F (36.4 C)  BP (!) 142/102  Pulse Rate 96  ECG Heart Rate 98  Resp (!) 25  SpO2 93 %  O2 Device Non-rebreather Mask  Patient Activity (if Appropriate) In bed  O2 Flow Rate (L/min) 15 L/min

## 2020-06-22 NOTE — Progress Notes (Signed)
SATURATION QUALIFICATIONS: (This note is used to comply with regulatory documentation for home oxygen)  Patient Saturations on Room Air at Rest = 88%  Patient Saturations on Room Air while Ambulating = 84%  Patient Saturations on 15 Liters of oxygen while Ambulating =89%  Please briefly explain why patient needs home oxygen:

## 2020-06-22 NOTE — Plan of Care (Signed)
  Problem: Education: Goal: Knowledge of General Education information will improve Description: Including pain rating scale, medication(s)/side effects and non-pharmacologic comfort measures Outcome: Progressing   Problem: Activity: Goal: Risk for activity intolerance will decrease Outcome: Not Progressing   Problem: Nutrition: Goal: Adequate nutrition will be maintained Outcome: Progressing

## 2020-06-22 NOTE — Progress Notes (Signed)
Nathaniel Daugherty  GYI:[id number] DOB: 10-Mar-1976 DOA: 06/14/2020 PCP: Jarold Motto, PA    Brief Narrative:  44 year old with a history of HTN, OSA, and obesity who tested positive for COVID-19 8/1 and presented to the hospital with worsening dyspnea and fatigue.  He was found to be in severe acute hypoxic respiratory failure due to Covid pneumonia and required high flow O2 support at presentation.  His hospital course has been prolonged due to severe persisting hypoxia.  He has required treatment with remdesivir, Actemra, and steroids.  Significant Events:  8/4 admit via [place]  Date of Positive COVID Test:  8/1  Vaccination Status: Unvaccinated  COVID-19 specific Treatment: Actemra 8/5 Steroid 8/4 > Remdesivir 8/4 > 8/8  Antimicrobials:  None  DVT prophylaxis: Lovenox  Subjective: Patient ambulated well with physical therapy yesterday.  He developed epistaxis yesterday evening while on high flow nasal cannula.  Is requiring 15 L nonrebreather support this morning to keep saturations in the mid 90s.  Vital signs are otherwise stable with exception to intermittent mild tachycardia.  Appears comfortable and in good spirits time of my exam with no new complaints.  Assessment & Plan:  COVID Pneumonia -severe persistent acute hypoxic respiratory failure completed Actemra and Remdesivir - steroids being weaned - cont to wean O2 as able - counseled pt on ongoing use of IS  Acute kidney injury Hemodynamically mediated/prerenal - crt climbing presently - encourage oral intake - follow   Recent Labs  Lab 06/18/20 0451 06/19/20 0437 06/20/20 0500 06/21/20 0109 06/22/20 0758  CREATININE 1.25* 1.33* 1.22 1.30* 1.34*    Hyponatremia Likely simply related to volume depletion -resolved  Mild transaminitis Likely related to Covid and treatment of same -follow  Recent Labs  Lab 06/18/20 0451 06/19/20 0437 06/20/20 0500 06/21/20 0109 06/22/20 0758  AST 20 19 31  44*  42*  ALT 23 27 43 60* 103*  ALKPHOS 61 61 57 53 54  BILITOT 0.9 0.9 1.3* 0.5 1.5*  PROT 5.8* 5.9* 5.6* 5.3* 6.0*  ALBUMIN 2.9* 2.9* 2.9* 2.8* 3.2*     Prediabetes/steroid-induced hyperglycemia A1c 6.4 06/19/2020-CBG quite variable presently -wean steroids and adjust insulin as needed  HTN Reasonably well-controlled at present  OSA Patient is having difficulty tolerating the hospital CPAP machine  Morbid obesity - Body mass index is 35.36 kg/m.  Code Status: FULL CODE Family Communication:  Status is: Inpatient  Remains inpatient appropriate because:Inpatient level of care appropriate due to severity of illness   Dispo: The patient is from: Home              Anticipated d/c is to: Home              Anticipated d/c date is: 3 days              Patient currently is not medically stable to d/c.  Consultants:  none  Objective: Blood pressure 133/90, pulse 97, temperature 98.2 F (36.8 C), temperature source Axillary, resp. rate 18, height 5\' 8"  (1.727 m), weight 105.5 kg, SpO2 93 %.  Intake/Output Summary (Last 24 hours) at 06/22/2020 0954 Last data filed at 06/22/2020 08/22/2020 Gross per 24 hour  Intake 680 ml  Output 700 ml  Net -20 ml   Filed Weights   06/14/20 1315  Weight: 105.5 kg    Examination: General: No acute respiratory distress Lungs: Fine diffuse crackles without significant change Cardiovascular: RRR without murmur or rub Abdomen: NT/ND, soft, BS positive  Extremities: No significant cyanosis, clubbing, or edema  bilateral lower extremities  CBC: Recent Labs  Lab 06/17/20 0501 06/19/20 0437 06/20/20 0500 06/21/20 0109 06/22/20 0758  WBC 6.3   < > 12.6* 12.7* 8.7  NEUTROABS 4.6  --   --   --   --   HGB 17.0   < > 17.3* 16.9 18.8*  HCT 49.8   < > 51.6 50.0 56.4*  MCV 89.2   < > 90.1 90.7 91.7  PLT 262   < > 304 343 362   < > = values in this interval not displayed.   Basic Metabolic Panel: Recent Labs  Lab 06/20/20 0500 06/21/20 0109  06/22/20 0758  NA 133* 135 136  K 5.1 4.3 4.4  CL 94* 97* 95*  CO2 29 29 32  GLUCOSE 179* 136* 172*  BUN 20 24* 22*  CREATININE 1.22 1.30* 1.34*  CALCIUM 8.3* 8.1* 8.3*   GFR: Estimated Creatinine Clearance: 85.4 mL/min (A) (by C-G formula based on SCr of 1.34 mg/dL (H)).  Liver Function Tests: Recent Labs  Lab 06/19/20 0437 06/20/20 0500 06/21/20 0109 06/22/20 0758  AST 19 31 44* 42*  ALT 27 43 60* 103*  ALKPHOS 61 57 53 54  BILITOT 0.9 1.3* 0.5 1.5*  PROT 5.9* 5.6* 5.3* 6.0*  ALBUMIN 2.9* 2.9* 2.8* 3.2*    Cardiac Enzymes: Recent Labs  Lab 06/16/20 0335  CKTOTAL 282    HbA1C: Hgb A1c MFr Bld  Date/Time Value Ref Range Status  06/19/2020 04:37 AM 6.4 (H) 4.8 - 5.6 % Final    Comment:    (NOTE) Pre diabetes:          5.7%-6.4%  Diabetes:              >6.4%  Glycemic control for   <7.0% adults with diabetes   09/27/2019 03:19 PM 6.1 4.6 - 6.5 % Final    Comment:    Glycemic Control Guidelines for People with Diabetes:Non Diabetic:  <6%Goal of Therapy: <7%Additional Action Suggested:  >8%     CBG: Recent Labs  Lab 06/21/20 0845 06/21/20 1149 06/21/20 1635 06/21/20 2058 06/22/20 0752  GLUCAP 159* 155* 409* 197* 198*    Recent Results (from the past 240 hour(s))  SARS Coronavirus 2 by RT PCR (hospital order, performed in [facility] hospital lab) Nasopharyngeal Nasopharyngeal Swab     Status: Abnormal   Collection Time: 06/14/20  3:03 PM   Specimen: Nasopharyngeal Swab  Result Value Ref Range Status   SARS Coronavirus 2 POSITIVE (A) NEGATIVE Final    Comment: RESULT CALLED TO, READ BACK BY AND VERIFIED WITH: W,VENEGAS @1727  06/14/20 EB (NOTE) SARS-CoV-2 target nucleic acids are DETECTED  SARS-CoV-2 RNA is generally detectable in upper respiratory specimens  during the acute phase of infection.  Positive results are indicative  of the presence of the identified virus, but do not rule out bacterial infection or co-infection with other pathogens  not detected by the test.  Clinical correlation with patient history and  other diagnostic information is necessary to determine patient infection status.  The expected result is negative.  Fact Sheet for Patients:   08/14/20   Fact Sheet for Healthcare Providers:   BoilerBrush.com.cy    This test is not yet approved or cleared by the https://pope.com/ FDA and  has been authorized for detection and/or diagnosis of SARS-CoV-2 by FDA under an Emergency Use Authorization (EUA).  This EUA will remain in effect (meaning this test can be  used) for the duration of  the COVID-19  declaration under Section 564(b)(1) of the Act, 21 U.S.C. section 360-bbb-3(b)(1), unless the authorization is terminated or revoked sooner.  Performed at [facility], [address], [place], [place] [address]      Scheduled Meds: . amLODipine  10 mg Oral Daily  . vitamin C  500 mg Oral Daily  . chlorpheniramine-HYDROcodone  5 mL Oral Q12H  . enoxaparin (LOVENOX) injection  50 mg Subcutaneous Q24H  . famotidine  20 mg Oral Daily  . insulin aspart  0-20 Units Subcutaneous TID WC  . insulin aspart  0-5 Units Subcutaneous QHS  . insulin aspart  4 Units Subcutaneous TID WC  . insulin glargine  10 Units Subcutaneous BID  . predniSONE  40 mg Oral Q breakfast  . zinc sulfate  220 mg Oral Daily      LOS: 8 days   Lonia Blood, MD [facility] Office  [phone] Pager - Text Page per Loretha Stapler  If 7PM-7AM, please contact night-coverage per Amion 06/22/2020, 9:54 AM

## 2020-06-22 NOTE — Progress Notes (Signed)
   06/22/20 2110  Assess: MEWS Score  Temp 98.1 F (36.7 C)  BP (!) 122/92  Pulse Rate (!) 111  Resp 20  SpO2 (!) 89 %  O2 Device Non-rebreather Mask  O2 Flow Rate (L/min) 15 L/min  FiO2 (%) 100 %  Assess: MEWS Score  MEWS Temp 0  MEWS Systolic 0  MEWS Pulse 2  MEWS RR 0  MEWS LOC 0  MEWS Score 2  MEWS Score Color Yellow  Assess: if the MEWS score is Yellow or Red  Were vital signs taken at a resting state? Yes  Focused Assessment Change from prior assessment (see assessment flowsheet)  Early Detection of Sepsis Score *See Row Information* Low  MEWS guidelines implemented *See Row Information* Yes  Treat  Pain Scale 0-10  Pain Score 0  Take Vital Signs  Increase Vital Sign Frequency  Yellow: Q 2hr X 2 then Q 4hr X 2, if remains yellow, continue Q 4hrs  Escalate  MEWS: Escalate Yellow: discuss with charge nurse/RN and consider discussing with provider and RRT  Notify: Charge Nurse/RN  Name of Charge Nurse/RN Notified mike  Date Charge Nurse/RN Notified 06/22/20  Time Charge Nurse/RN Notified 2120

## 2020-06-22 NOTE — Progress Notes (Signed)
Pt refused cpap for the night.  

## 2020-06-22 NOTE — Progress Notes (Signed)
Occupational Therapy Treatment Patient Details Name: Nathaniel Daugherty MRN: [id number] DOB: Feb 03, 1976 Today's Date: 06/22/2020    History of present illness Pt is 44 year old male with past medical history of hypertension, obesity class III and obstructive sleep apnea.  Patient tested positive for COVID-19 August 1 his symptoms have been generalized fatigue, diarrhea and rapid and progressive dyspnea. Initially required up to on 15 L per HFNC plus non re-breather mask with continued hypoxic respiratory failure.   OT comments  Pt progressing very well toward stated OT goals, able to tolerate increased level of activity this session. Pt is on 15L NRB mask this session with SpO2 ~89-93% with varying levels of activity. Educated pt on use of pulse ox and how to self monitor O2 sats in preparation for self management at home. Pt completed functional mobility beyond a household distance at mod I level. Mod cues needed to initiate rest breaks and to have pt self monitor SpO2 via pulse ox. Issued pt BLE dynamic HEP. Pt completed standing dynamic stretching routine that he is used to completing prior to working. He then completed BUE green theraband exercises. Pt reports expected level of fatigue after session. D/c recs remain appropriate, will continue to follow.   Follow Up Recommendations  No OT follow up;Supervision - Intermittent    Equipment Recommendations  3 in 1 bedside commode    Recommendations for Other Services      Precautions / Restrictions Precautions Precautions: Other (comment) Precaution Comments: monitor O2 and HR Restrictions Weight Bearing Restrictions: No       Mobility Bed Mobility Overal bed mobility: Modified Independent                Transfers Overall transfer level: Needs assistance Equipment used: None Transfers: Sit to/from Stand Sit to Stand: Supervision Stand pivot transfers: Supervision       General transfer comment: supervision/safety for  line management    Balance Overall balance assessment: Needs assistance Sitting-balance support: Feet supported;No upper extremity supported Sitting balance-Leahy Scale: Good     Standing balance support: No upper extremity supported Standing balance-Leahy Scale: Good Standing balance comment: able to complete standing HEP and dynamic stretching routine without external assist                           ADL either performed or assessed with clinical judgement   ADL Overall ADL's : Needs assistance/impaired                     Lower Body Dressing: Modified independent;Sitting/lateral leans;Sit to/from stand Lower Body Dressing Details (indicate cue type and reason): to don/doff new socks sitting EOB Toilet Transfer: Supervision/safety;Ambulation;Regular Teacher, adult education Details (indicate cue type and reason): able to complete mobility in room to toilet Toileting- Clothing Manipulation and Hygiene: Modified independent       Functional mobility during ADLs: Modified independent General ADL Comments: pt able to progress in activity tolerance for BADL mobility and engagement in HEP. Pt educated on self monitoring SpO2 sats with pulse ox     Vision Baseline Vision/History: No visual deficits Patient Visual Report: No change from baseline     Perception     Praxis      Cognition Arousal/Alertness: Awake/alert Behavior During Therapy: Flat affect Overall Cognitive Status: Within Functional Limits for tasks assessed  Exercises Other Exercises Other Exercises: Standing dynamic stretching routine for BLEs, trunk, and BUEs that pt usually performs at work and demo'd to OT Other Exercises: Standing written HEP Other Exercises: UE green band exercises   Shoulder Instructions       General Comments Pt on 15L HFNC NRB mask    Pertinent Vitals/ Pain       Pain Assessment: No/denies pain  Home  Living                                          Prior Functioning/Environment              Frequency  Min 3X/week        Progress Toward Goals  OT Goals(current goals can now be found in the care plan section)  Progress towards OT goals: Progressing toward goals  Acute Rehab OT Goals Patient Stated Goal: breathe better OT Goal Formulation: With patient Time For Goal Achievement: 07/01/20 Potential to Achieve Goals: Good  Plan Discharge plan needs to be updated    Co-evaluation                 AM-PAC OT "6 Clicks" Daily Activity     Outcome Measure   Help from another person eating meals?: None Help from another person taking care of personal grooming?: A Little Help from another person toileting, which includes using toliet, bedpan, or urinal?: A Little Help from another person bathing (including washing, rinsing, drying)?: A Little Help from another person to put on and taking off regular upper body clothing?: A Little Help from another person to put on and taking off regular lower body clothing?: A Little 6 Click Score: 19    End of Session Equipment Utilized During Treatment: Oxygen  OT Visit Diagnosis: Other (comment) (decreased cardiopulmonary tolerance)   Activity Tolerance Patient tolerated treatment well   Patient Left in bed;with call bell/phone within reach   Nurse Communication Mobility status        Time: 6789-3810 OT Time Calculation (min): 26 min  Charges: OT General Charges $OT Visit: 1 Visit OT Treatments $Therapeutic Activity: 23-37 mins  Dalphine Handing, MSOT, OTR/L Acute Rehabilitation Services [facility] Office Number: [phone] Pager: [phone]  Dalphine Handing 06/22/2020, 2:40 PM

## 2020-06-23 ENCOUNTER — Encounter (HOSPITAL_COMMUNITY): Payer: Self-pay | Admitting: *Deleted

## 2020-06-23 LAB — COMPREHENSIVE METABOLIC PANEL
ALT: 109 U/L — ABNORMAL HIGH (ref 0–44)
AST: 40 U/L (ref 15–41)
Albumin: 2.8 g/dL — ABNORMAL LOW (ref 3.5–5.0)
Alkaline Phosphatase: 49 U/L (ref 38–126)
Anion gap: 12 (ref 5–15)
BUN: 27 mg/dL — ABNORMAL HIGH (ref 6–20)
CO2: 27 mmol/L (ref 22–32)
Calcium: 8.1 mg/dL — ABNORMAL LOW (ref 8.9–10.3)
Chloride: 99 mmol/L (ref 98–111)
Creatinine, Ser: 1.58 mg/dL — ABNORMAL HIGH (ref 0.61–1.24)
GFR calc Af Amer: >60 mL/min (ref >60)
GFR calc non Af Amer: 54 mL/min — ABNORMAL LOW (ref >60)
Glucose, Bld: 96 mg/dL (ref 70–99)
Potassium: 4.4 mmol/L (ref 3.5–5.1)
Sodium: 138 mmol/L (ref 135–145)
Total Bilirubin: 1.2 mg/dL (ref 0.3–1.2)
Total Protein: 5.5 g/dL — ABNORMAL LOW (ref 6.5–8.1)

## 2020-06-23 LAB — C-REACTIVE PROTEIN: CRP: 1 mg/dL — ABNORMAL HIGH (ref <1.0)

## 2020-06-23 LAB — CBC
HCT: 50.7 % (ref 39.0–52.0)
Hemoglobin: 16.9 g/dL (ref 13.0–17.0)
MCH: 31 pg (ref 26.0–34.0)
MCHC: 33.3 g/dL (ref 30.0–36.0)
MCV: 92.9 fL (ref 80.0–100.0)
Platelets: 337 10*3/uL (ref 150–400)
RBC: 5.46 MIL/uL (ref 4.22–5.81)
RDW: 12.3 % (ref 11.5–15.5)
WBC: 8.7 10*3/uL (ref 4.0–10.5)
nRBC: 0 % (ref 0.0–0.2)

## 2020-06-23 LAB — GLUCOSE, CAPILLARY
Glucose-Capillary: 82 mg/dL (ref 70–99)
Glucose-Capillary: 257 mg/dL — ABNORMAL HIGH (ref 70–99)

## 2020-06-23 LAB — D-DIMER, QUANTITATIVE: D-Dimer, Quant: 0.41 ug/mL-FEU (ref 0.00–0.50)

## 2020-06-23 LAB — FERRITIN: Ferritin: 569 ng/mL — ABNORMAL HIGH (ref 24–336)

## 2020-06-23 MED ORDER — ACETAMINOPHEN 325 MG PO TABS: 650.0000 mg | ORAL_TABLET | Freq: Four times a day (QID) | ORAL | Status: AC | PRN

## 2020-06-23 MED ORDER — PREDNISONE 10 MG PO TABS: 10.0000 mg | ORAL_TABLET | Freq: Every day | ORAL | 0 refills | Status: DC

## 2020-06-23 MED ORDER — PREDNISONE 10 MG PO TABS: 10.0000 mg | ORAL_TABLET | Freq: Every day | ORAL | Status: DC

## 2020-06-23 MED ORDER — PREDNISONE 20 MG PO TABS: 20.0000 mg | ORAL_TABLET | Freq: Every day | ORAL | 0 refills | Status: DC

## 2020-06-23 MED ORDER — PREDNISONE 20 MG PO TABS: 20.0000 mg | ORAL_TABLET | Freq: Every day | ORAL | Status: DC

## 2020-06-23 NOTE — Progress Notes (Signed)
Physical Therapy Treatment Patient Details Name: Nathaniel Daugherty MRN: [id number] DOB: [birth year] Today's Date: 06/23/2020    History of Present Illness Pt is 44 year old male with past medical history of hypertension, obesity class III and obstructive sleep apnea.  Patient tested positive for COVID-19 August 1 his symptoms have been generalized fatigue, diarrhea and rapid and progressive dyspnea. Initially required up to on 15 L per HFNC plus non re-breather mask with continued hypoxic respiratory failure.    PT Comments    Pt received in bed on RA. SpO2 89%. Pt reports his primary goal is going home today. He demonstrates independence with bed mobility and transfers. Supervision provided for hallway ambulation 350' without AD on RA. Desat to 82%. No SOB/DOE noted. Quick rebound to 86% upon sitting EOB.    Follow Up Recommendations  No PT follow up     Equipment Recommendations  Other (comment) (home O2)    Recommendations for Other Services       Precautions / Restrictions Precautions Precautions: Other (comment) Precaution Comments: monitor O2 and HR    Mobility  Bed Mobility Overal bed mobility: Modified Independent                Transfers Overall transfer level: Modified independent Equipment used: None                Ambulation/Gait Ambulation/Gait assistance: Supervision Gait Distance (Feet): 350 Feet Assistive device: None Gait Pattern/deviations: Step-through pattern Gait velocity: WFL Gait velocity interpretation: >2.62 ft/sec, indicative of community ambulatory General Gait Details: Steady gait. Ambulated on RA. No rest breaks needed. Desat to 82%. Quick rebound to 86% upon sitting EOB. SpO2 89% at rest on RA.   Stairs             Wheelchair Mobility    Modified Rankin (Stroke Patients Only)       Balance Overall balance assessment: Mild deficits observed, not formally tested                                           Cognition Arousal/Alertness: Awake/alert Behavior During Therapy: Flat affect Overall Cognitive Status: Within Functional Limits for tasks assessed                                        Exercises      General Comments        Pertinent Vitals/Pain Pain Assessment: No/denies pain    Home Living                      Prior Function            PT Goals (current goals can now be found in the care plan section) Acute Rehab PT Goals Patient Stated Goal: home Progress towards PT goals: Progressing toward goals    Frequency    Min 3X/week      PT Plan Current plan remains appropriate    Co-evaluation              AM-PAC PT "6 Clicks" Mobility   Outcome Measure  Help needed turning from your back to your side while in a flat bed without using bedrails?: None Help needed moving from lying on your back to sitting on the side of a flat bed without using bedrails?:  None Help needed moving to and from a bed to a chair (including a wheelchair)?: None Help needed standing up from a chair using your arms (e.g., wheelchair or bedside chair)?: None Help needed to walk in hospital room?: None Help needed climbing 3-5 steps with a railing? : A Little 6 Click Score: 23    End of Session   Activity Tolerance: Patient tolerated treatment well Patient left: in bed;with call bell/phone within reach Nurse Communication: Mobility status PT Visit Diagnosis: Difficulty in walking, not elsewhere classified (R26.2)     Time: 1115-1130 PT Time Calculation (min) (ACUTE ONLY): 15 min  Charges:  $Gait Training: 8-22 mins                     Aida Raider, PT  Office # [phone] Pager [phone]    Ilda Foil 06/23/2020, 12:48 PM

## 2020-06-23 NOTE — Discharge Instructions (Signed)
Date of Positive COVID Test: 06/14/20 confirmable in Epic  Date Isolation Ends: 06/24/20    COVID-19 COVID-19 is a respiratory infection that is caused by a virus called severe acute respiratory syndrome coronavirus 2 (SARS-CoV-2). The disease is also known as coronavirus disease or novel coronavirus. In some people, the virus may not cause any symptoms. In others, it may cause a serious infection. The infection can get worse quickly and can lead to complications, such as:  Pneumonia, or infection of the lungs.  Acute respiratory distress syndrome or ARDS. This is a condition in which fluid build-up in the lungs prevents the lungs from filling with air and passing oxygen into the blood.  Acute respiratory failure. This is a condition in which there is not enough oxygen passing from the lungs to the body or when carbon dioxide is not passing from the lungs out of the body.  Sepsis or septic shock. This is a serious bodily reaction to an infection.  Blood clotting problems.  Secondary infections due to bacteria or fungus.  Organ failure. This is when your body's organs stop working. The virus that causes COVID-19 is contagious. This means that it can spread from person to person through droplets from coughs and sneezes (respiratory secretions). What are the causes? This illness is caused by a virus. You may catch the virus by:  Breathing in droplets from an infected person. Droplets can be spread by a person breathing, speaking, singing, coughing, or sneezing.  Touching something, like a table or a doorknob, that was exposed to the virus (contaminated) and then touching your mouth, nose, or eyes. What increases the risk? Risk for infection You are more likely to be infected with this virus if you:  Are within 6 feet (2 meters) of a person with COVID-19.  Provide care for or live with a person who is infected with COVID-19.  Spend time in crowded indoor spaces or live in shared  housing. Risk for serious illness You are more likely to become seriously ill from the virus if you:  Are 25 years of age or older. The higher your age, the more you are at risk for serious illness.  Live in a nursing home or long-term care facility.  Have cancer.  Have a long-term (chronic) disease such as: ? Chronic lung disease, including chronic obstructive pulmonary disease or asthma. ? A long-term disease that lowers your body's ability to fight infection (immunocompromised). ? Heart disease, including heart failure, a condition in which the arteries that lead to the heart become narrow or blocked (coronary artery disease), a disease which makes the heart muscle thick, weak, or stiff (cardiomyopathy). ? Diabetes. ? Chronic kidney disease. ? Sickle cell disease, a condition in which red blood cells have an abnormal "sickle" shape. ? Liver disease.  Are obese. What are the signs or symptoms? Symptoms of this condition can range from mild to severe. Symptoms may appear any time from 2 to 14 days after being exposed to the virus. They include:  A fever or chills.  A cough.  Difficulty breathing.  Headaches, body aches, or muscle aches.  Runny or stuffy (congested) nose.  A sore throat.  New loss of taste or smell. Some people may also have stomach problems, such as nausea, vomiting, or diarrhea. Other people may not have any symptoms of COVID-19. How is this diagnosed? This condition may be diagnosed based on:  Your signs and symptoms, especially if: ? You live in an area with a COVID-19  outbreak. ? You recently traveled to or from an area where the virus is common. ? You provide care for or live with a person who was diagnosed with COVID-19. ? You were exposed to a person who was diagnosed with COVID-19.  A physical exam.  Lab tests, which may include: ? Taking a sample of fluid from the back of your nose and throat (nasopharyngeal fluid), your nose, or your  throat using a swab. ? A sample of mucus from your lungs (sputum). ? Blood tests.  Imaging tests, which may include, X-rays, CT scan, or ultrasound. How is this treated? At present, there is no medicine to treat COVID-19. Medicines that treat other diseases are being used on a trial basis to see if they are effective against COVID-19. Your health care provider will talk with you about ways to treat your symptoms. For most people, the infection is mild and can be managed at home with rest, fluids, and over-the-counter medicines. Treatment for a serious infection usually takes places in a hospital intensive care unit (ICU). It may include one or more of the following treatments. These treatments are given until your symptoms improve.  Receiving fluids and medicines through an IV.  Supplemental oxygen. Extra oxygen is given through a tube in the nose, a face mask, or a hood.  Positioning you to lie on your stomach (prone position). This makes it easier for oxygen to get into the lungs.  Continuous positive airway pressure (CPAP) or bi-level positive airway pressure (BPAP) machine. This treatment uses mild air pressure to keep the airways open. A tube that is connected to a motor delivers oxygen to the body.  Ventilator. This treatment moves air into and out of the lungs by using a tube that is placed in your windpipe.  Tracheostomy. This is a procedure to create a hole in the neck so that a breathing tube can be inserted.  Extracorporeal membrane oxygenation (ECMO). This procedure gives the lungs a chance to recover by taking over the functions of the heart and lungs. It supplies oxygen to the body and removes carbon dioxide. Follow these instructions at home: Lifestyle  If you are sick, stay home except to get medical care. Your health care provider will tell you how long to stay home. Call your health care provider before you go for medical care.  Rest at home as told by your health care  provider.  Do not use any products that contain nicotine or tobacco, such as cigarettes, e-cigarettes, and chewing tobacco. If you need help quitting, ask your health care provider.  Return to your normal activities as told by your health care provider. Ask your health care provider what activities are safe for you. General instructions  Take over-the-counter and prescription medicines only as told by your health care provider.  Drink enough fluid to keep your urine pale yellow.  Keep all follow-up visits as told by your health care provider. This is important. How is this prevented?  There is no vaccine to help prevent COVID-19 infection. However, there are steps you can take to protect yourself and others from this virus. To protect yourself:   Do not travel to areas where COVID-19 is a risk. The areas where COVID-19 is reported change often. To identify high-risk areas and travel restrictions, check the CDC travel website: FatFares.com.br  If you live in, or must travel to, an area where COVID-19 is a risk, take precautions to avoid infection. ? Stay away from people who are  sick. ? Wash your hands often with soap and water for 20 seconds. If soap and water are not available, use an alcohol-based hand sanitizer. ? Avoid touching your mouth, face, eyes, or nose. ? Avoid going out in public, follow guidance from your state and local health authorities. ? If you must go out in public, wear a cloth face covering or face mask. Make sure your mask covers your nose and mouth. ? Avoid crowded indoor spaces. Stay at least 6 feet (2 meters) away from others. ? Disinfect objects and surfaces that are frequently touched every day. This may include:  Counters and tables.  Doorknobs and light switches.  Sinks and faucets.  Electronics, such as phones, remote controls, keyboards, computers, and tablets. To protect others: If you have symptoms of COVID-19, take steps to prevent  the virus from spreading to others.  If you think you have a COVID-19 infection, contact your health care provider right away. Tell your health care team that you think you may have a COVID-19 infection.  Stay home. Leave your house only to seek medical care. Do not use public transport.  Do not travel while you are sick.  Wash your hands often with soap and water for 20 seconds. If soap and water are not available, use alcohol-based hand sanitizer.  Stay away from other members of your household. Let healthy household members care for children and pets, if possible. If you have to care for children or pets, wash your hands often and wear a mask. If possible, stay in your own room, separate from others. Use a different bathroom.  Make sure that all people in your household wash their hands well and often.  Cough or sneeze into a tissue or your sleeve or elbow. Do not cough or sneeze into your hand or into the air.  Wear a cloth face covering or face mask. Make sure your mask covers your nose and mouth. Where to find more information  Centers for Disease Control and Prevention: StickerEmporium.tn  World Health Organization: https://thompson-craig.com/ Contact a health care provider if:  You live in or have traveled to an area where COVID-19 is a risk and you have symptoms of the infection.  You have had contact with someone who has COVID-19 and you have symptoms of the infection. Get help right away if:  You have trouble breathing.  You have pain or pressure in your chest.  You have confusion.  You have bluish lips and fingernails.  You have difficulty waking from sleep.  You have symptoms that get worse. These symptoms may represent a serious problem that is an emergency. Do not wait to see if the symptoms will go away. Get medical help right away. Call your local emergency services (911 in the [place] Do not drive yourself to the hospital.  Let the emergency medical personnel know if you think you have COVID-19. Summary  COVID-19 is a respiratory infection that is caused by a virus. It is also known as coronavirus disease or novel coronavirus. It can cause serious infections, such as pneumonia, acute respiratory distress syndrome, acute respiratory failure, or sepsis.  The virus that causes COVID-19 is contagious. This means that it can spread from person to person through droplets from breathing, speaking, singing, coughing, or sneezing.  You are more likely to develop a serious illness if you are 45 years of age or older, have a weak immune system, live in a nursing home, or have chronic disease.  There is no medicine  to treat COVID-19. Your health care provider will talk with you about ways to treat your symptoms.  Take steps to protect yourself and others from infection. Wash your hands often and disinfect objects and surfaces that are frequently touched every day. Stay away from people who are sick and wear a mask if you are sick. This information is not intended to replace advice given to you by your health care provider. Make sure you discuss any questions you have with your health care provider. Document Revised: 08/27/2019 Document Reviewed: 12/03/2018 Elsevier Patient Education  2020 Elsevier Inc.   COVID-19: How to Protect Yourself and Others Know how it spreads  There is currently no vaccine to prevent coronavirus disease 2019 (COVID-19).  The best way to prevent illness is to avoid being exposed to this virus.  The virus is thought to spread mainly from person-to-person. ? Between people who are in close contact with one another (within about 6 feet). ? Through respiratory droplets produced when an infected person coughs, sneezes or talks. ? These droplets can land in the mouths or noses of people who are nearby or possibly be inhaled into the lungs. ? COVID-19 may be spread by people who are not showing  symptoms. Everyone should Clean your hands often  Wash your hands often with soap and water for at least 20 seconds especially after you have been in a public place, or after blowing your nose, coughing, or sneezing.  If soap and water are not readily available, use a hand sanitizer that contains at least 60% alcohol. Cover all surfaces of your hands and rub them together until they feel dry.  Avoid touching your eyes, nose, and mouth with unwashed hands. Avoid close contact  Limit contact with others as much as possible.  Avoid close contact with people who are sick.  Put distance between yourself and other people. ? Remember that some people without symptoms may be able to spread virus. ? This is especially important for people who are at higher risk of getting very RetroStamps.it Cover your mouth and nose with a mask when around others  You could spread COVID-19 to others even if you do not feel sick.  Everyone should wear a mask in public settings and when around people not living in their household, especially when social distancing is difficult to maintain. ? Masks should not be placed on young children under age 68, anyone who has trouble breathing, or is unconscious, incapacitated or otherwise unable to remove the mask without assistance.  The mask is meant to protect other people in case you are infected.  Do NOT use a facemask meant for a Research scientist (physical sciences).  Continue to keep about 6 feet between yourself and others. The mask is not a substitute for social distancing. Cover coughs and sneezes  Always cover your mouth and nose with a tissue when you cough or sneeze or use the inside of your elbow.  Throw used tissues in the trash.  Immediately wash your hands with soap and water for at least 20 seconds. If soap and water are not readily available, clean your hands with a hand sanitizer that contains  at least 60% alcohol. Clean and disinfect  Clean AND disinfect frequently touched surfaces daily. This includes tables, doorknobs, light switches, countertops, handles, desks, phones, keyboards, toilets, faucets, and sinks. ktimeonline.com  If surfaces are dirty, clean them: Use detergent or soap and water prior to disinfection.  Then, use a household disinfectant. You can see a list  of EPA-registered household disinfectants here. SouthAmericaFlowers.co.uk 07/14/2019 This information is not intended to replace advice given to you by your health care provider. Make sure you discuss any questions you have with your health care provider. Document Revised: 07/22/2019 Document Reviewed: 05/20/2019 Elsevier Patient Education  2020 [employer].    COVID-19 Frequently Asked Questions COVID-19 (coronavirus disease) is an infection that is caused by a large family of viruses. Some viruses cause illness in people and others cause illness in animals like camels, cats, and bats. In some cases, the viruses that cause illness in animals can spread to humans. Where did the coronavirus come from? In December 2019, [place] told the [employer] [facility] of several cases of lung disease (human respiratory illness). These cases were linked to an open seafood and livestock market in the city of [place]. The link to the seafood and livestock market suggests that the virus may have spread from animals to humans. However, since that first outbreak in December, the virus has also been shown to spread from person to person. What is the name of the disease and the virus? Disease name Early on, this disease was called novel coronavirus. This is because scientists determined that the disease was caused by a new (novel) respiratory virus. The World Health Organization [facility]) has now named the disease COVID-19, or coronavirus disease. Virus name The virus  that causes the disease is called severe acute respiratory syndrome coronavirus 2 (SARS-CoV-2). More information on disease and virus naming World Health Organization [facility]): www.who.int/emergencies/diseases/novel-coronavirus-2019/technical-guidance/naming-the-coronavirus-disease-(covid-2019)-and-the-virus-that-causes-it Who is at risk for complications from coronavirus disease? Some people may be at higher risk for complications from coronavirus disease. This includes older adults and people who have chronic diseases, such as heart disease, diabetes, and lung disease. If you are at higher risk for complications, take these extra precautions:  Stay home as much as possible.  Avoid social gatherings and travel.  Avoid close contact with others. Stay at least 6 ft (2 m) away from others, if possible.  Wash your hands often with soap and water for at least 20 seconds.  Avoid touching your face, mouth, nose, or eyes.  Keep supplies on hand at home, such as food, medicine, and cleaning supplies.  If you must go out in public, wear a cloth face covering or face mask. Make sure your mask covers your nose and mouth. How does coronavirus disease spread? The virus that causes coronavirus disease spreads easily from person to person (is contagious). You may catch the virus by:  Breathing in droplets from an infected person. Droplets can be spread by a person breathing, speaking, singing, coughing, or sneezing.  Touching something, like a table or a doorknob, that was exposed to the virus (contaminated) and then touching your mouth, nose, or eyes. Can I get the virus from touching surfaces or objects? There is still a lot that we do not know about the virus that causes coronavirus disease. Scientists are basing a lot of information on what they know about similar viruses, such as:  Viruses cannot generally survive on surfaces for long. They need a human body (host) to survive.  It is more likely  that the virus is spread by close contact with people who are sick (direct contact), such as through: ? Shaking hands or hugging. ? Breathing in respiratory droplets that travel through the air. Droplets can be spread by a person breathing, speaking, singing, coughing, or sneezing.  It is less likely that the virus is spread when a person  touches a surface or object that has the virus on it (indirect contact). The virus may be able to enter the body if the person touches a surface or object and then touches his or her face, eyes, nose, or mouth. Can a person spread the virus without having symptoms of the disease? It may be possible for the virus to spread before a person has symptoms of the disease, but this is most likely not the main way the virus is spreading. It is more likely for the virus to spread by being in close contact with people who are sick and breathing in the respiratory droplets spread by a person breathing, speaking, singing, coughing, or sneezing. What are the symptoms of coronavirus disease? Symptoms vary from person to person and can range from mild to severe. Symptoms may include:  Fever or chills.  Cough.  Difficulty breathing or feeling short of breath.  Headaches, body aches, or muscle aches.  Runny or stuffy (congested) nose.  Sore throat.  New loss of taste or smell.  Nausea, vomiting, or diarrhea. These symptoms can appear anywhere from 2 to 14 days after you have been exposed to the virus. Some people may not have any symptoms. If you develop symptoms, call your health care provider. People with severe symptoms may need hospital care. Should I be tested for this virus? Your health care provider will decide whether to test you based on your symptoms, history of exposure, and your risk factors. How does a health care provider test for this virus? Health care providers will collect samples to send for testing. Samples may include:  Taking a swab of fluid from  the back of your nose and throat, your nose, or your throat.  Taking fluid from the lungs by having you cough up mucus (sputum) into a sterile cup.  Taking a blood sample. Is there a treatment or vaccine for this virus? Currently, there is no vaccine to prevent coronavirus disease. Also, there are no medicines like antibiotics or antivirals to treat the virus. A person who becomes sick is given supportive care, which means rest and fluids. A person may also relieve his or her symptoms by using over-the-counter medicines that treat sneezing, coughing, and runny nose. These are the same medicines that a person takes for the common cold. If you develop symptoms, call your health care provider. People with severe symptoms may need hospital care. What can I do to protect myself and my family from this virus?     You can protect yourself and your family by taking the same actions that you would take to prevent the spread of other viruses. Take the following actions:  Wash your hands often with soap and water for at least 20 seconds. If soap and water are not available, use alcohol-based hand sanitizer.  Avoid touching your face, mouth, nose, or eyes.  Cough or sneeze into a tissue, sleeve, or elbow. Do not cough or sneeze into your hand or the air. ? If you cough or sneeze into a tissue, throw it away immediately and wash your hands.  Disinfect objects and surfaces that you frequently touch every day.  Stay away from people who are sick.  Avoid going out in public, follow guidance from your state and local health authorities.  Avoid crowded indoor spaces. Stay at least 6 ft (2 m) away from others.  If you must go out in public, wear a cloth face covering or face mask. Make sure your mask covers your  nose and mouth.  Stay home if you are sick, except to get medical care. Call your health care provider before you get medical care. Your health care provider will tell you how long to stay  home.  Make sure your vaccines are up to date. Ask your health care provider what vaccines you need. What should I do if I need to travel? Follow travel recommendations from your local health authority, the CDC, and WHO. Travel information and advice  Centers for Disease Control and Prevention (CDC): GeminiCard.gl  World Health Organization [facility]): PreviewDomains.se Know the risks and take action to protect your health  You are at higher risk of getting coronavirus disease if you are traveling to areas with an outbreak or if you are exposed to travelers from areas with an outbreak.  Wash your hands often and practice good hygiene to lower the risk of catching or spreading the virus. What should I do if I am sick? General instructions to stop the spread of infection  Wash your hands often with soap and water for at least 20 seconds. If soap and water are not available, use alcohol-based hand sanitizer.  Cough or sneeze into a tissue, sleeve, or elbow. Do not cough or sneeze into your hand or the air.  If you cough or sneeze into a tissue, throw it away immediately and wash your hands.  Stay home unless you must get medical care. Call your health care provider or local health authority before you get medical care.  Avoid public areas. Do not take public transportation, if possible.  If you can, wear a mask if you must go out of the house or if you are in close contact with someone who is not sick. Make sure your mask covers your nose and mouth. Keep your home clean  Disinfect objects and surfaces that are frequently touched every day. This may include: ? Counters and tables. ? Doorknobs and light switches. ? Sinks and faucets. ? Electronics such as phones, remote controls, keyboards, computers, and tablets.  Wash dishes in hot, soapy water or use a dishwasher. Air-dry your dishes.  Wash  laundry in hot water. Prevent infecting other household members  Let healthy household members care for children and pets, if possible. If you have to care for children or pets, wash your hands often and wear a mask.  Sleep in a different bedroom or bed, if possible.  Do not share personal items, such as razors, toothbrushes, deodorant, combs, brushes, towels, and washcloths. Where to find more information Centers for Disease Control and Prevention (CDC)  Information and news updates: CardRetirement.cz World Health Organization [facility])  Information and news updates: AffordableSalon.es  Coronavirus health topic: https://thompson-craig.com/  Questions and answers on COVID-19: kruiseway.com  Global tracker: who.sprinklr.com American Academy of Pediatrics (AAP)  Information for families: www.healthychildren.org/English/health-issues/conditions/chest-lungs/Pages/2019-Novel-Coronavirus.aspx The coronavirus situation is changing rapidly. Check your local health authority website or the CDC and [facility] websites for updates and news. When should I contact a health care provider?  Contact your health care provider if you have symptoms of an infection, such as fever or cough, and you: ? Have been near anyone who is known to have coronavirus disease. ? Have come into contact with a person who is suspected to have coronavirus disease. ? Have traveled to an area where there is an outbreak of COVID-19. When should I get emergency medical care?  Get help right away by calling your local emergency services (911 in the [place] if you have: ? Trouble  breathing. ? Pain or pressure in your chest. ? Confusion. ? Blue-tinged lips and fingernails. ? Difficulty waking from sleep. ? Symptoms that get worse. Let the emergency medical personnel know if you think you have coronavirus disease. Summary  A new  respiratory virus is spreading from person to person and causing COVID-19 (coronavirus disease).  The virus that causes COVID-19 appears to spread easily. It spreads from one person to another through droplets from breathing, speaking, singing, coughing, or sneezing.  Older adults and those with chronic diseases are at higher risk of disease. If you are at higher risk for complications, take extra precautions.  There is currently no vaccine to prevent coronavirus disease. There are no medicines, such as antibiotics or antivirals, to treat the virus.  You can protect yourself and your family by washing your hands often, avoiding touching your face, and covering your coughs and sneezes. This information is not intended to replace advice given to you by your health care provider. Make sure you discuss any questions you have with your health care provider. Document Revised: 08/27/2019 Document Reviewed: 02/23/2019 Elsevier Patient Education  2020 [employer].

## 2020-06-23 NOTE — Progress Notes (Signed)
   06/23/20   To Whom it may concern,  Nathaniel Daugherty was admitted to [facility] on 06/14/2020  and remained under my care in the hospital through 06/23/20.  He has been advised that he should not return to work until 06/28/20. Based upon his illness, this return to work date could be delayed further once the patient is seen in follow up by his primary care provider.   Sincerely,  Lonia Blood, MD [facility] Office  [phone]

## 2020-06-23 NOTE — Progress Notes (Signed)
PT Progress Note  SATURATION QUALIFICATIONS: (This note is used to comply with regulatory documentation for home oxygen)  Patient Saturations on Room Air at Rest = 89%  Patient Saturations on Room Air while Ambulating = 82%   Please briefly explain why patient needs home oxygen: Pt declining use of supplemental O2 for mobility. No signs of SOB/DOE. Able to hold a conversation. Ambulated 350' without AD supervision.   Aida Raider, PT  Office # [phone] Pager [phone]

## 2020-06-23 NOTE — Discharge Summary (Signed)
DISCHARGE SUMMARY  Nathaniel Daugherty  MR#: [id number]  DOB:09-19-1976  Date of Admission: 06/14/2020 Date of Discharge: 06/23/2020  Attending Physician:Calleen Alvis Silvestre Gunner, MD  Patient's SHF:[facility], [name], [name]  Consults: none  Disposition: d/c home   Date of Positive COVID Test: 06/14/20 (confirmable in Epic)  Date Isolation Ends: 06/24/20  COVID-19 specific Treatment: Actemra 8/5 Steroid 8/4 > 8/13 Remdesivir 8/4 > 8/8  Follow-up Appts:  Follow-up Information    Jarold Motto, [name] Follow up in 5 day(s).   Specialty: Physician Assistant Contact information: [address] [place] [address] [phone]               Tests Needing Follow-up: -assess renal fxn -check saturations at rest and w/ ambulation  -Assess CBG/A1c once patient off steroids  Discharge Diagnoses: COVID Pneumonia Severe persistent acute hypoxic respiratory failure Acute kidney injury Hyponatremia Mild transaminitis Prediabetes/steroid-induced hyperglycemia HTN OSA Obesity - Body mass index is 35.36 kg/m.  Initial presentation: 44 year old with a history of HTN, OSA, and obesity who reportedly tested positive for COVID-19 8/1 and presented to the hospital with worsening dyspnea and fatigue.  He was found to be in severe acute hypoxic respiratory failure due to Covid pneumonia and required high flow O2 support at presentation. A CoViD test in the ED was +.  His hospital course has been prolonged due to severe persisting hypoxia.  He has required treatment with remdesivir, Actemra, and steroids.  Hospital Course:  COVID Pneumonia -severe persistent acute hypoxic respiratory failure completed Actemra and Remdesivir courses - steroids being weaned at time of d/c -in the final days of the patient's hospital stay he was improving very rapidly -his oxygen saturations were 89-94% on room air at rest on the day he was discharged -with ambulation for 350 feet he was noted to  desaturate as low as 82% but was not symptomatic and tolerated this well -he was not interested in supplemental oxygen support and even without it his saturations quickly recovered -it was therefore not felt to be unreasonable to allow the patient to be discharged home without oxygen as per his request   Acute kidney injury Hemodynamically mediated/prerenal - crt fluctuating between 1.35 and 1.58 at time of discharge -baseline creatinine appears to be entirely normal -it is felt that the patient's intake will improve at home and this will likely assist in his creatinine improvement via better hydration -follow-up basic metabolic panel is suggested in the outpatient setting to reassess his renal function  Hyponatremia Likely simply related to volume depletion -resolved  Mild transaminitis Felt to be related to Covid and treatment of same -improving at time of discharge  Prediabetes/steroid-induced hyperglycemia A1c 6.4 06/19/2020-CBG quite variable presently -wean steroids -ongoing outpatient follow-up will be required  HTN controlled at time of discharge without medical therapy  OSA Patient had difficulty tolerating the hospital CPAP machine -resume his usual CPAP regimen at home  Obesity - Body mass index is 35.36 kg/m.   Allergies as of 06/23/2020   No Known Allergies     Medication List    STOP taking these medications   diclofenac 50 MG EC tablet Commonly known as: VOLTAREN   hydrochlorothiazide 25 MG tablet Commonly known as: HYDRODIURIL     TAKE these medications   acetaminophen 325 MG tablet Commonly known as: TYLENOL Take 2 tablets (650 mg total) by mouth every 6 (six) hours as needed for mild pain or headache (fever >/= 101).   amLODipine 10 MG tablet Commonly known as: NORVASC TAKE 1  TABLET BY MOUTH EVERY DAY   predniSONE 20 MG tablet Commonly known as: DELTASONE Take 1 tablet (20 mg total) by mouth daily with breakfast. Start taking on: June 24, 2020    predniSONE 10 MG tablet Commonly known as: DELTASONE Take 1 tablet (10 mg total) by mouth daily with breakfast. Start taking on: June 27, 2020       Day of Discharge BP 105/78 (BP Location: Right Arm)   Pulse 100   Temp 98.1 F (36.7 C) (Oral)   Resp 17   Ht 5\' 8"  (1.727 m)   Wt 105.5 kg   SpO2 96%   BMI 35.36 kg/m   Physical Exam: General: No acute respiratory distress Lungs: Fine scattered crackles bilaterally Cardiovascular: Regular rate and rhythm without murmur gallop or rub normal S1 and S2 Abdomen: Nontender, nondistended, soft, bowel sounds positive, no rebound, no ascites, no appreciable mass Extremities: No significant cyanosis, clubbing, or edema bilateral lower extremities  Basic Metabolic Panel: Recent Labs  Lab 06/19/20 0437 06/20/20 0500 06/21/20 0109 06/22/20 0758 06/23/20 0411  NA 133* 133* 135 136 138  K 4.9 5.1 4.3 4.4 4.4  CL 95* 94* 97* 95* 99  CO2 33* 29 29 32 27  GLUCOSE 261* 179* 136* 172* 96  BUN 21* 20 24* 22* 27*  CREATININE 1.33* 1.22 1.30* 1.34* 1.58*  CALCIUM 8.2* 8.3* 8.1* 8.3* 8.1*    Liver Function Tests: Recent Labs  Lab 06/19/20 0437 06/20/20 0500 06/21/20 0109 06/22/20 0758 06/23/20 0411  AST 19 31 44* 42* 40  ALT 27 43 60* 103* 109*  ALKPHOS 61 57 53 54 49  BILITOT 0.9 1.3* 0.5 1.5* 1.2  PROT 5.9* 5.6* 5.3* 6.0* 5.5*  ALBUMIN 2.9* 2.9* 2.8* 3.2* 2.8*    CBC: Recent Labs  Lab 06/17/20 0501 06/17/20 0501 06/19/20 0437 06/20/20 0500 06/21/20 0109 06/22/20 0758 06/23/20 0411  WBC 6.3   < > 9.4 12.6* 12.7* 8.7 8.7  NEUTROABS 4.6  --   --   --   --   --   --   HGB 17.0   < > 17.0 17.3* 16.9 18.8* 16.9  HCT 49.8   < > 50.4 51.6 50.0 56.4* 50.7  MCV 89.2   < > 91.1 90.1 90.7 91.7 92.9  PLT 262   < > 285 304 343 362 337   < > = values in this interval not displayed.    CBG: Recent Labs  Lab 06/22/20 1220 06/22/20 1648 06/22/20 2114 06/23/20 0755 06/23/20 1156  GLUCAP 131* 239* 116* 82 257*     Recent Results (from the past 240 hour(s))  SARS Coronavirus 2 by RT PCR (hospital order, performed in [facility] hospital lab) Nasopharyngeal Nasopharyngeal Swab     Status: Abnormal   Collection Time: 06/14/20  3:03 PM   Specimen: Nasopharyngeal Swab  Result Value Ref Range Status   SARS Coronavirus 2 POSITIVE (A) NEGATIVE Final    Comment: RESULT CALLED TO, READ BACK BY AND VERIFIED WITH: W,VENEGAS @1727  06/14/20 EB (NOTE) SARS-CoV-2 target nucleic acids are DETECTED  SARS-CoV-2 RNA is generally detectable in upper respiratory specimens  during the acute phase of infection.  Positive results are indicative  of the presence of the identified virus, but do not rule out bacterial infection or co-infection with other pathogens not detected by the test.  Clinical correlation with patient history and  other diagnostic information is necessary to determine patient infection status.  The expected result is negative.  Fact Sheet  for Patients:   BoilerBrush.com.cy   Fact Sheet for Healthcare Providers:   https://pope.com/    This test is not yet approved or cleared by the [place] FDA and  has been authorized for detection and/or diagnosis of SARS-CoV-2 by FDA under an Emergency Use Authorization (EUA).  This EUA will remain in effect (meaning this test can be  used) for the duration of  the COVID-19 declaration under Section 564(b)(1) of the Act, 21 U.S.C. section 360-bbb-3(b)(1), unless the authorization is terminated or revoked sooner.  Performed at [facility], [address] [place], [place] [address]      Time spent in discharge (includes decision making & examination of pt): 35 minutes  06/23/2020, 4:03 PM   Lonia Blood, MD [facility] Office  [phone]

## 2020-06-23 NOTE — Progress Notes (Signed)
Patient was discharged home by MD order; discharged instructions  review and give to patient with care notes; IV DIC; skin intact; patient will be escorted to the car by nurse tech via wheelchair.  

## 2020-06-26 ENCOUNTER — Telehealth: Payer: Self-pay

## 2020-06-26 NOTE — Telephone Encounter (Cosign Needed)
Transition Care Management Follow-up Telephone Call  Date of discharge and from where: 8/138/21 [facility]   How have you been since you were released from the hospital? ok  Any questions or concerns? No  Items Reviewed:  Did the pt receive and understand the discharge instructions provided? Yes   Medications obtained and verified? Yes   Any new allergies since your discharge? No   Dietary orders reviewed? Yes  Do you have support at home? Yes   Functional Questionnaire: (I = Independent and D = Dependent) ADLs: I  Bathing/Dressing- I  Meal Prep- I  Eating- I  Maintaining continence- I  Transferring/Ambulation- I  Managing Meds- I  Follow up appointments reviewed:   PCP Hospital f/u appt confirmed? Yes  Scheduled to see Jarold Motto , PA on August 20th @ 12:30 p.m  Specialist Hospital f/u appt confirmed? No    Are transportation arrangements needed? No   If their condition worsens, is the pt aware to call PCP or go to the Emergency Dept.? Yes  Was the patient provided with contact information for the PCP's office or ED? Yes  Was to pt encouraged to call back with questions or concerns? Yes

## 2020-06-30 ENCOUNTER — Telehealth (INDEPENDENT_AMBULATORY_CARE_PROVIDER_SITE_OTHER): Payer: PRIVATE HEALTH INSURANCE | Admitting: Physician Assistant

## 2020-06-30 ENCOUNTER — Encounter: Payer: Self-pay | Admitting: Physician Assistant

## 2020-06-30 ENCOUNTER — Other Ambulatory Visit: Payer: Self-pay

## 2020-06-30 ENCOUNTER — Ambulatory Visit: Payer: PRIVATE HEALTH INSURANCE | Admitting: Physician Assistant

## 2020-06-30 ENCOUNTER — Ambulatory Visit (HOSPITAL_COMMUNITY)
Admission: RE | Admit: 2020-06-30 | Discharge: 2020-06-30 | Disposition: A | Discharge status: Home / Self Care | Payer: PRIVATE HEALTH INSURANCE | Source: Ambulatory Visit | Attending: Physician Assistant | Admitting: Physician Assistant

## 2020-06-30 VITALS — BP 129/108 | HR 113 | Ht 68.0 in | Wt 212.0 lb

## 2020-06-30 DIAGNOSIS — U071 COVID-19: Secondary | ICD-10-CM

## 2020-06-30 DIAGNOSIS — R079 Chest pain, unspecified: Secondary | ICD-10-CM | POA: Diagnosis not present

## 2020-06-30 DIAGNOSIS — R0602 Shortness of breath: Secondary | ICD-10-CM | POA: Diagnosis not present

## 2020-06-30 DIAGNOSIS — N179 Acute kidney failure, unspecified: Secondary | ICD-10-CM

## 2020-06-30 DIAGNOSIS — R361 Hematospermia: Secondary | ICD-10-CM | POA: Diagnosis not present

## 2020-06-30 DIAGNOSIS — J9601 Acute respiratory failure with hypoxia: Secondary | ICD-10-CM

## 2020-06-30 MED ORDER — IOHEXOL 350 MG/ML SOLN
100.0000 mL | Freq: Once | INTRAVENOUS | Status: AC | PRN
  Administered 2020-06-30: 100 mL via INTRAVENOUS

## 2020-06-30 NOTE — Progress Notes (Signed)
Virtual Visit via Video   I connected with Nathaniel Daugherty on 06/30/20 at 12:30 PM EDT by a video enabled telemedicine application and verified that I am speaking with the correct person using two identifiers. Location patient: Home Location provider:  [facility], Office Persons participating in the virtual visit: Shakil Duriel, [place] PA-C, Corky Mull, LPN   I discussed the limitations of evaluation and management by telemedicine and the availability of in person appointments. The patient expressed understanding and agreed to proceed.  I acted as a Neurosurgeon for [employer], PA-C [employer], LPN   Subjective:   HPI:  COVID-19; PNA; SOB Pt following up today, was in the hospital 8/4 thru 8/13.Pt was dx with Acute hypoxemic respiratory failure due to COVID-19. Treated with Actemra and Remdesivir. Pt is stilling having a dry non-productive cough, some SOB on exertion. Denies chest pain or fever. Has not purchased a pulse ox. He was given a 3 day course of oral prednisone to take. He continues to have ongoing SOB. Cannot walk very far without feeling SOB and feels he cannot return to work yet. He was on supplemental [place] O2 during hospitalization, did not require mechanical ventilation.  AKI Thought to be due to dehydration. Creatinine was up to 1.58. He has seen been drinking plenty of fluids.  Blood in sperm Pt stated he had blood in his semen yesterday when he ejaculated. Denies pain. Denies urethral trauma. Was on Lovenox during hospitalization.  ROS: See pertinent positives and negatives per HPI.  Patient Active Problem List   Diagnosis Date Noted  . Acute hypoxemic respiratory failure due to COVID-19 (HCC) 06/14/2020  . OSA on CPAP   . Hypertension   . Insulin resistance 09/29/2019  . Hyperlipidemia 09/29/2019  . Obesity 01/18/2019  . Essential hypertension 01/18/2019    Social History   Tobacco Use  . Smoking status: Never Smoker  . Smokeless  tobacco: Never Used  Substance Use Topics  . Alcohol use: No    Current Outpatient Medications:  .  acetaminophen (TYLENOL) 325 MG tablet, Take 2 tablets (650 mg total) by mouth every 6 (six) hours as needed for mild pain or headache (fever >/= 101)., Disp: , Rfl:  .  amLODipine (NORVASC) 10 MG tablet, TAKE 1 TABLET BY MOUTH EVERY DAY (Patient taking differently: Take 10 mg by mouth daily. ), Disp: 90 tablet, Rfl: 1  No Known Allergies  Objective:   VITALS: Per patient if applicable, see vitals. GENERAL: Alert, appears well and in no acute distress. HEENT: Atraumatic, conjunctiva clear, no obvious abnormalities on inspection of external nose and ears. NECK: Normal movements of the head and neck. CARDIOPULMONARY: No increased WOB. Speaking in clear sentences. I:E ratio WNL.  MS: Moves all visible extremities without noticeable abnormality. PSYCH: Pleasant and cooperative, well-groomed. Speech normal rate and rhythm. Affect is appropriate. Insight and judgement are appropriate. Attention is focused, linear, and appropriate.  NEURO: CN grossly intact. Oriented as arrived to appointment on time with no prompting. Moves both UE equally.  SKIN: No obvious lesions, wounds, erythema, or cyanosis noted on face or hands.  Vitals:   06/30/20 1218 06/30/20 1351  BP: (!) 129/108   Pulse: (!) 112 (!) 113  SpO2:  92%     Assessment and Plan:   Nathaniel Daugherty was seen today for hospitalization follow-up.  Diagnoses and all orders for this visit:  Acute hypoxemic respiratory failure due to COVID-19 [facility]); COVID-19;  Chest pain, unspecified type; Shortness of breath Hospital course reviewed.  He is tachycardic and has slight WOB on video. He came into the office for labs and his O2 was measured at 92%. Stat labs obtained for renal function to perform imaging. CT angio scheduled for later today to r/o PE. Discussed very low threshold to go to the ER if symptoms worsen. Patient verbalized  understanding to plan. -     CT ANGIO CHEST PE W OR WO CONTRAST; Future -     CBC with Differential/Platelet; Future -     Cancel: Comprehensive metabolic panel; Future -     Comprehensive metabolic panel; Future -     Comprehensive metabolic panel -     CBC with Differential/Platelet  Blood in semen Unclear etiology. Will obtain UA. If occurs again, will refer to urology. -     Urinalysis, Routine w reflex microscopic; Future -     Urinalysis, Routine w reflex microscopic  AKI (acute kidney injury) (HCC) Update renal function today.   . Reviewed expectations re: course of current medical issues. . Discussed self-management of symptoms. . Outlined signs and symptoms indicating need for more acute intervention. . Patient verbalized understanding and all questions were answered. Marland Kitchen Health Maintenance issues including appropriate healthy diet, exercise, and smoking avoidance were discussed with patient. . See orders for this visit as documented in the electronic medical record.  I discussed the assessment and treatment plan with the patient. The patient was provided an opportunity to ask questions and all were answered. The patient agreed with the plan and demonstrated an understanding of the instructions.   The patient was advised to call back or seek an in-person evaluation if the symptoms worsen or if the condition fails to improve as anticipated.   CMA or LPN served as scribe during this visit. History, Physical, and Plan performed by medical provider. The above documentation has been reviewed and is accurate and complete.  Time spent with patient today was >45 minutes which consisted of chart review, discussing diagnosis, work up, treatment answering questions and documentation.   [place], [name] 06/30/2020

## 2020-07-01 LAB — CBC WITH DIFFERENTIAL/PLATELET
Absolute Monocytes: 872 cells/uL (ref 200–950)
Basophils Absolute: 69 cells/uL (ref 0–200)
Basophils Relative: 1.4 %
Eosinophils Absolute: 78 cells/uL (ref 15–500)
Eosinophils Relative: 1.6 %
HCT: 46.9 % (ref 38.5–50.0)
Hemoglobin: 15.5 g/dL (ref 13.2–17.1)
Lymphs Abs: 2528 cells/uL (ref 850–3900)
MCH: 30 pg (ref 27.0–33.0)
MCHC: 33 g/dL (ref 32.0–36.0)
MCV: 90.9 fL (ref 80.0–100.0)
MPV: 9.1 fL (ref 7.5–12.5)
Monocytes Relative: 17.8 %
Neutro Abs: 1352 cells/uL — ABNORMAL LOW (ref 1500–7800)
Neutrophils Relative %: 27.6 %
Platelets: 191 10*3/uL (ref 140–400)
RBC: 5.16 10*6/uL (ref 4.20–5.80)
RDW: 12.7 % (ref 11.0–15.0)
Total Lymphocyte: 51.6 %
WBC: 4.9 10*3/uL (ref 3.8–10.8)

## 2020-07-01 LAB — URINALYSIS, ROUTINE W REFLEX MICROSCOPIC
Bilirubin Urine: NEGATIVE
Glucose, UA: NEGATIVE
Hgb urine dipstick: NEGATIVE
Ketones, ur: NEGATIVE
Leukocytes,Ua: NEGATIVE
Nitrite: NEGATIVE
Protein, ur: NEGATIVE
Specific Gravity, Urine: 1.012 (ref 1.001–1.03)
pH: 7.5 (ref 5.0–8.0)

## 2020-07-01 LAB — COMPREHENSIVE METABOLIC PANEL
AG Ratio: 1.9 (calc) (ref 1.0–2.5)
ALT: 52 U/L — ABNORMAL HIGH (ref 9–46)
AST: 21 U/L (ref 10–40)
Albumin: 3.9 g/dL (ref 3.6–5.1)
Alkaline phosphatase (APISO): 58 U/L (ref 36–130)
BUN: 13 mg/dL (ref 7–25)
CO2: 29 mmol/L (ref 20–32)
Calcium: 8.6 mg/dL (ref 8.6–10.3)
Chloride: 102 mmol/L (ref 98–110)
Creat: 1.16 mg/dL (ref 0.60–1.35)
Globulin: 2.1 g/dL (calc) (ref 1.9–3.7)
Glucose, Bld: 94 mg/dL (ref 65–99)
Potassium: 4.6 mmol/L (ref 3.5–5.3)
Sodium: 136 mmol/L (ref 135–146)
Total Bilirubin: 2 mg/dL — ABNORMAL HIGH (ref 0.2–1.2)
Total Protein: 6 g/dL — ABNORMAL LOW (ref 6.1–8.1)

## 2020-07-03 ENCOUNTER — Ambulatory Visit (HOSPITAL_COMMUNITY): Payer: PRIVATE HEALTH INSURANCE

## 2020-07-04 ENCOUNTER — Telehealth: Payer: Self-pay | Admitting: Physician Assistant

## 2020-07-04 NOTE — Telephone Encounter (Signed)
Patient is calling in asking if [name] received his paperwork that was faxed over last Friday from his HR department.

## 2020-07-04 NOTE — Telephone Encounter (Signed)
Called patient and advised that we have not received the paperwork and to have them fax it again.

## 2020-07-04 NOTE — Telephone Encounter (Signed)
Please call pt and tell him we did not receive paperwork, have him fax again.

## 2020-07-05 ENCOUNTER — Other Ambulatory Visit: Payer: Self-pay

## 2020-07-05 ENCOUNTER — Ambulatory Visit (INDEPENDENT_AMBULATORY_CARE_PROVIDER_SITE_OTHER): Payer: PRIVATE HEALTH INSURANCE | Admitting: Physician Assistant

## 2020-07-05 ENCOUNTER — Encounter: Payer: Self-pay | Admitting: Physician Assistant

## 2020-07-05 VITALS — BP 146/10 | HR 115 | Temp 97.7°F | Ht 68.0 in | Wt 210.2 lb

## 2020-07-05 DIAGNOSIS — R0602 Shortness of breath: Secondary | ICD-10-CM

## 2020-07-05 DIAGNOSIS — U071 COVID-19: Secondary | ICD-10-CM | POA: Diagnosis not present

## 2020-07-05 DIAGNOSIS — J9601 Acute respiratory failure with hypoxia: Secondary | ICD-10-CM

## 2020-07-05 MED ORDER — METOPROLOL SUCCINATE ER 25 MG PO TB24: 25.0000 mg | ORAL_TABLET | Freq: Every day | ORAL | 1 refills | Status: DC

## 2020-07-05 NOTE — Patient Instructions (Signed)
It was great to see you!  Let's add on a medication to help slow down your heart rate -- lets start metoprolol 25 mg daily.  Let's follow-up in 1 month, sooner if you have concerns.  Take care,  Jarold Motto PA-C

## 2020-07-05 NOTE — Progress Notes (Signed)
Nathaniel Daugherty is a 44 y.o. male is here for follow up.  I acted as a Neurosurgeon for [employer], PA-C Corky Mull, LPN   History of Present Illness:   Chief Complaint  Patient presents with  . Shortness of Breath    HPI   SOB/Acute hypoxemic respiratory failure COVID-19 Pt says SOB is better, still has coughing at night, non-productive. Negative CTA 06/30/20. He is hoping to go back to work soon. Denies fever, chills, chest pain, severe SOB. He is currently taking HCTZ 25 mg and Norvasc 10 mg daily.  Symptoms are improving daily.  Wt Readings from Last 3 Encounters:  07/05/20 210 lb 4 oz (95.4 kg)  06/30/20 212 lb (96.2 kg)  06/14/20 232 lb 9.4 oz (105.5 kg)    BP Readings from Last 3 Encounters:  07/05/20 (!) 160/100  06/30/20 (!) 129/108  06/23/20 105/78     Health Maintenance Due  Topic Date Due  . Hepatitis C Screening  Never done    Past Medical History:  Diagnosis Date  . Hypertension   . OSA on CPAP      Social History   Tobacco Use  . Smoking status: Never Smoker  . Smokeless tobacco: Never Used  Vaping Use  . Vaping Use: Never used  Substance Use Topics  . Alcohol use: No  . Drug use: No    History reviewed. No pertinent surgical history.  Family History  Problem Relation Age of Onset  . Hypertension Mother   . Hypertension Father   . Prostate cancer Neg Hx   . Colon cancer Neg Hx     PMHx, SurgHx, SocialHx, FamHx, Medications, and Allergies were reviewed in the Visit Navigator and updated as appropriate.   Patient Active Problem List   Diagnosis Date Noted  . Acute hypoxemic respiratory failure due to COVID-19 (HCC) 06/14/2020  . OSA on CPAP   . Hypertension   . Insulin resistance 09/29/2019  . Hyperlipidemia 09/29/2019  . Obesity 01/18/2019  . Essential hypertension 01/18/2019    Social History   Tobacco Use  . Smoking status: Never Smoker  . Smokeless tobacco: Never Used  Vaping Use  . Vaping Use: Never used    Substance Use Topics  . Alcohol use: No  . Drug use: No    Current Medications and Allergies:    Current Outpatient Medications:  .  acetaminophen (TYLENOL) 325 MG tablet, Take 2 tablets (650 mg total) by mouth every 6 (six) hours as needed for mild pain or headache (fever >/= 101)., Disp: , Rfl:  .  amLODipine (NORVASC) 10 MG tablet, TAKE 1 TABLET BY MOUTH EVERY DAY (Patient taking differently: Take 10 mg by mouth daily. ), Disp: 90 tablet, Rfl: 1 .  hydrochlorothiazide (HYDRODIURIL) 25 MG tablet, Take 25 mg by mouth daily., Disp: , Rfl:   No Known Allergies  Review of Systems   ROS  Negative unless otherwise specified per HPI.  Vitals:   Vitals:   07/05/20 0735  BP: (!) 160/100  Pulse: (!) 116  Temp: 97.7 F (36.5 C)  TempSrc: Temporal  SpO2: 95%  Weight: 210 lb 4 oz (95.4 kg)  Height: 5\' 8"  (1.727 m)     Body mass index is 31.97 kg/m.   Physical Exam:    Physical Exam Vitals and nursing note reviewed.  Constitutional:      General: He is not in acute distress.    Appearance: He is well-developed. He is not ill-appearing or toxic-appearing.  Cardiovascular:  Rate and Rhythm: Regular rhythm. Tachycardia present.     Pulses: Normal pulses.     Heart sounds: Normal heart sounds, S1 normal and S2 normal.     Comments: No LE edema Pulmonary:     Effort: Pulmonary effort is normal.     Breath sounds: Normal breath sounds.  Skin:    General: Skin is warm and dry.  Neurological:     Mental Status: He is alert.     GCS: GCS eye subscore is 4. GCS verbal subscore is 5. GCS motor subscore is 6.  Psychiatric:        Speech: Speech normal.        Behavior: Behavior normal. Behavior is cooperative.     Negative unless otherwise specified per HPI.   Assessment and Plan:    [name] was seen today for shortness of breath.  Diagnoses and all orders for this visit:  Acute hypoxemic respiratory failure due to COVID-19 [facility]); Shortness of breath Symptoms  improving. Remains tachycardic and BP is above goal -- will start low dose metoprolol 25 mg daily extended release. Continue HCTZ and Amlodipine. Work- note provided. Follow-up in 1 month.  . Reviewed expectations re: course of current medical issues. . Discussed self-management of symptoms. . Outlined signs and symptoms indicating need for more acute intervention. . Patient verbalized understanding and all questions were answered. . See orders for this visit as documented in the electronic medical record. . Patient received an After Visit Summary.  CMA or LPN served as scribe during this visit. History, Physical, and Plan performed by medical provider. The above documentation has been reviewed and is accurate and complete.  Jarold Motto, PA-C [place], [place] 07/05/2020  Follow-up: No follow-ups on file.

## 2020-07-06 ENCOUNTER — Telehealth: Payer: Self-pay | Admitting: Physician Assistant

## 2020-07-06 NOTE — Telephone Encounter (Signed)
Pt called stating Nathaniel Daugherty wrote him a letter to go back to work. Pt states the letter needs to include any restrictions. Please advise.

## 2020-07-07 ENCOUNTER — Telehealth: Payer: Self-pay

## 2020-07-07 NOTE — Telephone Encounter (Signed)
Please see message and advise if restrictions?

## 2020-07-07 NOTE — Telephone Encounter (Signed)
Pt called again asking about the restrictions. His work told him they have to know Monday.   Pt also asked if we received a fax from his HR, but I am unaware

## 2020-07-07 NOTE — Telephone Encounter (Signed)
Left message on voicemail to call office. Letter is in My Chart dated 07/05/2020 with the restrictions on, he will have to print it or we can print it out for him to pickup.  I have not received paperwork.

## 2020-07-07 NOTE — Telephone Encounter (Signed)
Patient called in stating that HR is requesting a more in depth response to his light duty restriction letter. Wanting to know what patient can and cannot do. I informed patient that usually HR has an idea of what is considered light duty per position of different companies, but that I would route a message to PCP to see if she can be more specific. Advised PCP is out of the office until Monday. Please advise

## 2020-07-07 NOTE — Telephone Encounter (Signed)
error 

## 2020-07-10 ENCOUNTER — Telehealth: Payer: Self-pay

## 2020-07-10 ENCOUNTER — Encounter: Payer: Self-pay | Admitting: Physician Assistant

## 2020-07-10 NOTE — Telephone Encounter (Signed)
Please call patient and see what he needs.  At this point if needed, we could have the letter stating that he should be sitting in a chair (non-ambulatory) for 90% of his duties, with minimal lifting (no greater than 10 lbs.) I'm not sure how to be more specific.

## 2020-07-10 NOTE — Telephone Encounter (Signed)
Pt states he would like to be called

## 2020-07-10 NOTE — Telephone Encounter (Signed)
Spoke to pt asked him what he needs in the letter? Pt said needs to be more specific for duties. Told him [name] said she can say that you should be sitting in a chair (non-ambulatory) for 90% of his duties, with minimal lifting (no greater than 10 lbs.) Pt said that would be fine. Told okay I will do the letter right away and you will be able to access it from your My Chart. Pt verbalized understanding and asked if I received his papers? Told him no. Pt said okay will contact them again.

## 2020-07-21 ENCOUNTER — Telehealth: Payer: Self-pay | Admitting: Physician Assistant

## 2020-07-21 ENCOUNTER — Encounter: Payer: Self-pay | Admitting: Physician Assistant

## 2020-07-21 NOTE — Telephone Encounter (Signed)
Patient called in and wanted see if it was ok to return to work at 100% for Monday. Patient will need a note to specify that he can return back to work at !00%.

## 2020-07-21 NOTE — Telephone Encounter (Signed)
Spoke to pt asked him if he was feeling okay to go back to work without limitations? Pt said yes. Told him okay but with strict understanding if you start to feel bad will need to go back to limitations. Pt verbalized understanding. Told pt I will do the letter and he can access it from My Chart. Pt verbalized understanding. Letter done.

## 2020-07-24 ENCOUNTER — Telehealth: Payer: Self-pay | Admitting: *Deleted

## 2020-07-24 NOTE — Telephone Encounter (Signed)
Spoke to pt told him I just received FMLA paper work do you still need it filled out? Pt said yes. Asked him if it is from [facility] till went back to work? Pt said yes, I went back on 9/3 for 1/2 day. Told pt okay I will work on papers and let you know when I am done. Pt verbalized understanding.

## 2020-07-26 NOTE — Telephone Encounter (Signed)
Left message on voicemail to call office.  

## 2020-07-27 ENCOUNTER — Other Ambulatory Visit: Payer: Self-pay | Admitting: Physician Assistant

## 2020-07-28 ENCOUNTER — Telehealth: Payer: Self-pay | Admitting: *Deleted

## 2020-07-28 NOTE — Telephone Encounter (Signed)
Spoke to pt told him FMLA paperwork is ready. Pt said to fax. Told him I do not have fax #. Looked thru paperwork and found [employer] [phone]. Pt said that is correct. Told him will fax now and keep copy for you, will give at upcoming appt 9/27. Pt verbalized understanding. FMLA faxed.

## 2020-07-28 NOTE — Telephone Encounter (Signed)
Spoke to pt told him tried fax number twice did not go through. Pt gave another fax # [phone]. Told him okay will send now. Pt verbalized understanding. Fax went thru.

## 2020-08-07 ENCOUNTER — Other Ambulatory Visit: Payer: Self-pay

## 2020-08-07 ENCOUNTER — Encounter: Payer: Self-pay | Admitting: Physician Assistant

## 2020-08-07 ENCOUNTER — Ambulatory Visit (INDEPENDENT_AMBULATORY_CARE_PROVIDER_SITE_OTHER): Payer: PRIVATE HEALTH INSURANCE | Admitting: Physician Assistant

## 2020-08-07 VITALS — BP 140/100 | HR 100 | Temp 98.1°F | Ht 68.0 in | Wt 221.2 lb

## 2020-08-07 DIAGNOSIS — R748 Abnormal levels of other serum enzymes: Secondary | ICD-10-CM

## 2020-08-07 DIAGNOSIS — I10 Essential (primary) hypertension: Secondary | ICD-10-CM

## 2020-08-07 DIAGNOSIS — E785 Hyperlipidemia, unspecified: Secondary | ICD-10-CM | POA: Diagnosis not present

## 2020-08-07 MED ORDER — LOSARTAN POTASSIUM-HCTZ 50-12.5 MG PO TABS
1.0000 | ORAL_TABLET | Freq: Every day | ORAL | 1 refills | Status: DC
Start: 2020-08-07 — End: 2020-09-03

## 2020-08-07 MED ORDER — METOPROLOL SUCCINATE ER 25 MG PO TB24: 25.0000 mg | ORAL_TABLET | Freq: Every day | ORAL | 1 refills | Status: DC

## 2020-08-07 NOTE — Patient Instructions (Signed)
It was great to see you!  Continue Metroprolol and Amlodopine.  Stop hydrochlorothiazide. Start losartan-hydrochlorothiazide.  Upper respiratory infection recommendations for those with current or history of elevated blood pressure: 1. Avoid all over-the-counter antihistamines except Claritin/Loratadine and Zyrtec/Cetrizine. 2. Avoid all combination including cold sinus allergies flu decongestant and sleep medications 3. You can use Robitussin DM Mucinex and Mucinex DM for cough.   Let's follow-up in 4-6 weeks, sooner if you have concerns.  Take care,  Jarold Motto PA-C

## 2020-08-07 NOTE — Progress Notes (Signed)
Nathaniel Daugherty is a 44 y.o. male here for a follow up of a pre-existing problem.  I acted as a Neurosurgeon for [employer], PA-C Corky Mull, LPN   History of Present Illness:   Chief Complaint  Patient presents with  . Hypertension    HPI   HTN Currently taking Norvasc 10 mg, HCTZ 25 mg daily, metoprolol 25 mg xr. He has not been checking blood pressure at home. Complaint with medication. Patient denies chest pain, SOB, blurred vision, dizziness, unusual headaches, lower leg swelling. Denies excessive caffeine intake, stimulant usage, excessive alcohol intake, or increase in salt consumption.  BP Readings from Last 3 Encounters:  08/07/20 (!) 140/100  07/05/20 (!) 146/10  06/30/20 (!) 129/108   Elevated LFTs Found on last few blood draws. LFTs have been as high as ALT 109. Likely related to prior COVID infection. Tbili was 2.0 at last lab draw in August. Very rare alcohol intake. Denies abdominal pain.  Cold symptoms Having slight chest congestion. Has tried a little bit of dayquil without relief of symptoms. Denies: chest pain, SOB, fever, chills.  Past Medical History:  Diagnosis Date  . Hypertension   . OSA on CPAP      Social History   Tobacco Use  . Smoking status: Never Smoker  . Smokeless tobacco: Never Used  Vaping Use  . Vaping Use: Never used  Substance Use Topics  . Alcohol use: No  . Drug use: No    History reviewed. No pertinent surgical history.  Family History  Problem Relation Age of Onset  . Hypertension Mother   . Hypertension Father   . Prostate cancer Neg Hx   . Colon cancer Neg Hx     No Known Allergies  Current Medications:   Current Outpatient Medications:  .  acetaminophen (TYLENOL) 325 MG tablet, Take 2 tablets (650 mg total) by mouth every 6 (six) hours as needed for mild pain or headache (fever >/= 101)., Disp: , Rfl:  .  amLODipine (NORVASC) 10 MG tablet, TAKE 1 TABLET BY MOUTH EVERY DAY (Patient taking differently:  Take 10 mg by mouth daily. ), Disp: 90 tablet, Rfl: 1 .  metoprolol succinate (TOPROL-XL) 25 MG 24 hr tablet, TAKE 1 TABLET BY MOUTH EVERY DAY, Disp: 30 tablet, Rfl: 1 .  losartan-hydrochlorothiazide (HYZAAR) 50-12.5 MG tablet, Take 1 tablet by mouth daily., Disp: 30 tablet, Rfl: 1   Review of Systems:   ROS Negative unless otherwise specified per HPI.  Vitals:   Vitals:   08/07/20 1516  BP: (!) 140/100  Pulse: 100  Temp: 98.1 F (36.7 C)  TempSrc: Temporal  SpO2: 96%  Weight: 221 lb 4 oz (100.4 kg)  Height: 5\' 8"  (1.727 m)     Body mass index is 33.64 kg/m.  Physical Exam:   Physical Exam Vitals and nursing note reviewed.  Constitutional:      General: He is not in acute distress.    Appearance: He is well-developed. He is not ill-appearing or toxic-appearing.  Cardiovascular:     Rate and Rhythm: Normal rate and regular rhythm.     Pulses: Normal pulses.     Heart sounds: Normal heart sounds, S1 normal and S2 normal.     Comments: No LE edema Pulmonary:     Effort: Pulmonary effort is normal.     Breath sounds: Normal breath sounds.  Skin:    General: Skin is warm and dry.  Neurological:     Mental Status: He is alert.  GCS: GCS eye subscore is 4. GCS verbal subscore is 5. GCS motor subscore is 6.  Psychiatric:        Speech: Speech normal.        Behavior: Behavior normal. Behavior is cooperative.      Assessment and Plan:   [name] was seen today for hypertension.  Diagnoses and all orders for this visit:  Elevated liver enzymes Update lipid panel today. Further recommendations based on results. -     Hepatic function panel; Future -     Hepatic function panel  Hyperlipidemia, unspecified hyperlipidemia type Update labs and recalculate ASCVD, recommend medications as indicated. -     Lipid panel; Future -     Lipid panel  Essential hypertension Uncontrolled. Continue Amlodopine 10 mg and Metoprolol 25 mg XR daily. Stop HCTZ. Start  Losartan-HCTZ 50-12.5 mg. Follow-up in 4-6 weeks, sooner if concerns.  Other orders -     losartan-hydrochlorothiazide (HYZAAR) 50-12.5 MG tablet; Take 1 tablet by mouth daily.  CMA or LPN served as scribe during this visit. History, Physical, and Plan performed by medical provider. The above documentation has been reviewed and is accurate and complete.   Jarold Motto, PA-C

## 2020-08-08 LAB — HEPATIC FUNCTION PANEL
AG Ratio: 1.7 (calc) (ref 1.0–2.5)
ALT: 17 U/L (ref 9–46)
AST: 14 U/L (ref 10–40)
Albumin: 4.5 g/dL (ref 3.6–5.1)
Alkaline phosphatase (APISO): 67 U/L (ref 36–130)
Bilirubin, Direct: 0.2 mg/dL (ref 0.0–0.2)
Globulin: 2.6 g/dL (calc) (ref 1.9–3.7)
Indirect Bilirubin: 0.6 mg/dL (calc) (ref 0.2–1.2)
Total Bilirubin: 0.8 mg/dL (ref 0.2–1.2)
Total Protein: 7.1 g/dL (ref 6.1–8.1)

## 2020-08-08 LAB — LIPID PANEL
Cholesterol: 219 mg/dL — ABNORMAL HIGH (ref <200)
HDL: 52 mg/dL (ref >=40)
LDL Cholesterol (Calc): 147 mg/dL (calc) — ABNORMAL HIGH
Non-HDL Cholesterol (Calc): 167 mg/dL (calc) — ABNORMAL HIGH (ref <130)
Total CHOL/HDL Ratio: 4.2 (calc) (ref <5.0)
Triglycerides: 90 mg/dL (ref <150)

## 2020-09-02 ENCOUNTER — Other Ambulatory Visit: Payer: Self-pay | Admitting: Physician Assistant

## 2020-09-08 ENCOUNTER — Encounter: Payer: Self-pay | Admitting: Physician Assistant

## 2020-09-08 ENCOUNTER — Other Ambulatory Visit: Payer: Self-pay

## 2020-09-08 ENCOUNTER — Ambulatory Visit (INDEPENDENT_AMBULATORY_CARE_PROVIDER_SITE_OTHER): Payer: PRIVATE HEALTH INSURANCE | Admitting: Physician Assistant

## 2020-09-08 VITALS — BP 150/98 | HR 98 | Temp 98.0°F | Ht 68.0 in | Wt 230.4 lb

## 2020-09-08 DIAGNOSIS — I10 Essential (primary) hypertension: Secondary | ICD-10-CM

## 2020-09-08 DIAGNOSIS — R0602 Shortness of breath: Secondary | ICD-10-CM

## 2020-09-08 NOTE — Progress Notes (Signed)
Nathaniel Daugherty is a 44 y.o. male is here to discuss:  I acted as a Neurosurgeon for [employer], PA-C Corky Mull, LPN   History of Present Illness:   Chief Complaint  Patient presents with  . f/u SOB  . Hypertension    HPI   SOB Pt following up, denies SOB. Has been feeling good. He is back to work full time, no difficulties.  Hypertension Pt currently taking Amlodipine 10 mg daily, Losartan-HCTZ 50-12.5 mg daily, Metoprolol 25 mg daily. Pt denies headaches, dizziness, blurred vision, chest pain, SOB or lower leg edema. Denies excessive caffeine intake, stimulant usage, excessive alcohol intake or increase in salt consumption.  BP Readings from Last 3 Encounters:  09/08/20 (!) 150/98  08/07/20 (!) 140/100  07/05/20 (!) 146/10     Health Maintenance Due  Topic Date Due  . Hepatitis C Screening  Never done  . COVID-19 Vaccine (1) Never done    Past Medical History:  Diagnosis Date  . Hypertension   . OSA on CPAP      Social History   Tobacco Use  . Smoking status: Never Smoker  . Smokeless tobacco: Never Used  Vaping Use  . Vaping Use: Never used  Substance Use Topics  . Alcohol use: No  . Drug use: No    History reviewed. No pertinent surgical history.  Family History  Problem Relation Age of Onset  . Hypertension Mother   . Hypertension Father   . Prostate cancer Neg Hx   . Colon cancer Neg Hx     PMHx, SurgHx, SocialHx, FamHx, Medications, and Allergies were reviewed in the Visit Navigator and updated as appropriate.   Patient Active Problem List   Diagnosis Date Noted  . Acute hypoxemic respiratory failure due to COVID-19 (HCC) 06/14/2020  . OSA on CPAP   . Hypertension   . Insulin resistance 09/29/2019  . Hyperlipidemia 09/29/2019  . Obesity 01/18/2019  . Essential hypertension 01/18/2019    Social History   Tobacco Use  . Smoking status: Never Smoker  . Smokeless tobacco: Never Used  Vaping Use  . Vaping Use: Never used    Substance Use Topics  . Alcohol use: No  . Drug use: No    Current Medications and Allergies:    Current Outpatient Medications:  .  acetaminophen (TYLENOL) 325 MG tablet, Take 2 tablets (650 mg total) by mouth every 6 (six) hours as needed for mild pain or headache (fever >/= 101)., Disp: , Rfl:  .  amLODipine (NORVASC) 10 MG tablet, TAKE 1 TABLET BY MOUTH EVERY DAY (Patient taking differently: Take 10 mg by mouth daily. ), Disp: 90 tablet, Rfl: 1 .  losartan-hydrochlorothiazide (HYZAAR) 50-12.5 MG tablet, TAKE 1 TABLET BY MOUTH EVERY DAY, Disp: 30 tablet, Rfl: 1 .  metoprolol succinate (TOPROL-XL) 25 MG 24 hr tablet, Take 1 tablet (25 mg total) by mouth daily., Disp: 90 tablet, Rfl: 1  No Known Allergies  Review of Systems   ROS Negative unless otherwise specified per HPI.  Vitals:   Vitals:   09/08/20 1519  BP: (!) 150/98  Pulse: 98  Temp: 98 F (36.7 C)  TempSrc: Temporal  SpO2: 96%  Weight: 230 lb 6.1 oz (104.5 kg)  Height: 5\' 8"  (1.727 m)     Body mass index is 35.03 kg/m.   Physical Exam:    Physical Exam Vitals and nursing note reviewed.  Constitutional:      General: He is not in acute distress.  Appearance: He is well-developed. He is not ill-appearing or toxic-appearing.  Cardiovascular:     Rate and Rhythm: Normal rate and regular rhythm.     Pulses: Normal pulses.     Heart sounds: Normal heart sounds, S1 normal and S2 normal.     Comments: No LE edema Pulmonary:     Effort: Pulmonary effort is normal.     Breath sounds: Normal breath sounds.  Skin:    General: Skin is warm and dry.  Neurological:     Mental Status: He is alert.     GCS: GCS eye subscore is 4. GCS verbal subscore is 5. GCS motor subscore is 6.  Psychiatric:        Speech: Speech normal.        Behavior: Behavior normal. Behavior is cooperative.      Assessment and Plan:    Nathaniel Daugherty was seen today for f/u sob and hypertension.  Diagnoses and all orders for this  visit:  Shortness of breath Symptoms have essentially resolved. He feels much better. Continue to monitor as needed.  Essential hypertension Unfortunately he has not taken his medication yet today. Continue Norvasc 10 mg, Losartan 50- HCTZ 12.5 mg daily, Toprol 25 mg daily. Will not make any changes today, but told him to be sure to take medication prior to next visit so we can adequately assess level of control.  CMA or LPN served as scribe during this visit. History, Physical, and Plan performed by medical provider. The above documentation has been reviewed and is accurate and complete.  Jarold Motto, PA-C [place] [place] 09/08/2020  Follow-up: No follow-ups on file.

## 2020-09-08 NOTE — Patient Instructions (Signed)
It was great to see you!  Follow-up for your physical as instructed.  PLEASE take your blood pressure medication the day of your physical.  Take care,  Jarold Motto PA-C

## 2020-09-18 ENCOUNTER — Other Ambulatory Visit: Payer: Self-pay | Admitting: Physician Assistant

## 2020-09-27 ENCOUNTER — Other Ambulatory Visit: Payer: Self-pay

## 2020-09-27 ENCOUNTER — Ambulatory Visit (INDEPENDENT_AMBULATORY_CARE_PROVIDER_SITE_OTHER): Payer: PRIVATE HEALTH INSURANCE | Admitting: Physician Assistant

## 2020-09-27 ENCOUNTER — Encounter: Payer: Self-pay | Admitting: Physician Assistant

## 2020-09-27 VITALS — BP 150/100 | HR 88 | Temp 98.3°F | Ht 68.5 in | Wt 228.5 lb

## 2020-09-27 DIAGNOSIS — G4733 Obstructive sleep apnea (adult) (pediatric): Secondary | ICD-10-CM | POA: Diagnosis not present

## 2020-09-27 DIAGNOSIS — E669 Obesity, unspecified: Secondary | ICD-10-CM | POA: Diagnosis not present

## 2020-09-27 DIAGNOSIS — I10 Essential (primary) hypertension: Secondary | ICD-10-CM

## 2020-09-27 DIAGNOSIS — Z0000 Encounter for general adult medical examination without abnormal findings: Secondary | ICD-10-CM | POA: Diagnosis not present

## 2020-09-27 MED ORDER — LOSARTAN POTASSIUM-HCTZ 100-12.5 MG PO TABS
1.0000 | ORAL_TABLET | Freq: Every day | ORAL | 1 refills | Status: DC
Start: 2020-09-27 — End: 2021-03-16

## 2020-09-27 NOTE — Patient Instructions (Signed)
It was great to see you!  Increase your lostartan-hydrochlorothiazide to your new dose 100 - 12.5 mg.  Continue Norvasc and Metoprolol as they are prescribed.  Let's follow-up in 3 months.  Take care,  [facility] Maintenance, Male Adopting a healthy lifestyle and getting preventive care are important in promoting health and wellness. Ask your health care provider about:  The right schedule for you to have regular tests and exams.  Things you can do on your own to prevent diseases and keep yourself healthy. What should I know about diet, weight, and exercise? Eat a healthy diet   Eat a diet that includes plenty of vegetables, fruits, low-fat dairy products, and lean protein.  Do not eat a lot of foods that are high in solid fats, added sugars, or sodium. Maintain a healthy weight Body mass index (BMI) is a measurement that can be used to identify possible weight problems. It estimates body fat based on height and weight. Your health care provider can help determine your BMI and help you achieve or maintain a healthy weight. Get regular exercise Get regular exercise. This is one of the most important things you can do for your health. Most adults should:  Exercise for at least 150 minutes each week. The exercise should increase your heart rate and make you sweat (moderate-intensity exercise).  Do strengthening exercises at least twice a week. This is in addition to the moderate-intensity exercise.  Spend less time sitting. Even light physical activity can be beneficial. Watch cholesterol and blood lipids Have your blood tested for lipids and cholesterol at 42 years of age, then have this test every 5 years. You may need to have your cholesterol levels checked more often if:  Your lipid or cholesterol levels are high.  You are older than 42 years of age.  You are at high risk for heart disease. What should I know about cancer screening? Many types of cancers can be  detected early and may often be prevented. Depending on your health history and family history, you may need to have cancer screening at various ages. This may include screening for:  Colorectal cancer.  Prostate cancer.  Skin cancer.  Lung cancer. What should I know about heart disease, diabetes, and high blood pressure? Blood pressure and heart disease  High blood pressure causes heart disease and increases the risk of stroke. This is more likely to develop in people who have high blood pressure readings, are of African descent, or are overweight.  Talk with your health care provider about your target blood pressure readings.  Have your blood pressure checked: ? Every 3-5 years if you are 26-46 years of age. ? Every year if you are 26 years old or older.  If you are between the ages of 24 and 40 and are a current or former smoker, ask your health care provider if you should have a one-time screening for abdominal aortic aneurysm (AAA). Diabetes Have regular diabetes screenings. This checks your fasting blood sugar level. Have the screening done:  Once every three years after age 67 if you are at a normal weight and have a low risk for diabetes.  More often and at a younger age if you are overweight or have a high risk for diabetes. What should I know about preventing infection? Hepatitis B If you have a higher risk for hepatitis B, you should be screened for this virus. Talk with your health care provider to find out if you  are at risk for hepatitis B infection. Hepatitis C Blood testing is recommended for:  Everyone born from [birth year] through 1963.  Anyone with known risk factors for hepatitis C. Sexually transmitted infections (STIs)  You should be screened each year for STIs, including gonorrhea and chlamydia, if: ? You are sexually active and are younger than 42 years of age. ? You are older than 42 years of age and your health care provider tells you that you are at risk  for this type of infection. ? Your sexual activity has changed since you were last screened, and you are at increased risk for chlamydia or gonorrhea. Ask your health care provider if you are at risk.  Ask your health care provider about whether you are at high risk for HIV. Your health care provider may recommend a prescription medicine to help prevent HIV infection. If you choose to take medicine to prevent HIV, you should first get tested for HIV. You should then be tested every 3 months for as long as you are taking the medicine. Follow these instructions at home: Lifestyle  Do not use any products that contain nicotine or tobacco, such as cigarettes, e-cigarettes, and chewing tobacco. If you need help quitting, ask your health care provider.  Do not use street drugs.  Do not share needles.  Ask your health care provider for help if you need support or information about quitting drugs. Alcohol use  Do not drink alcohol if your health care provider tells you not to drink.  If you drink alcohol: ? Limit how much you have to 0-2 drinks a day. ? Be aware of how much alcohol is in your drink. In the [place] one drink equals one 12 oz bottle of beer (355 mL), one 5 oz glass of wine (148 mL), or one 1 oz glass of hard liquor (44 mL). General instructions  Schedule regular health, dental, and eye exams.  Stay current with your vaccines.  Tell your health care provider if: ? You often feel depressed. ? You have ever been abused or do not feel safe at home. Summary  Adopting a healthy lifestyle and getting preventive care are important in promoting health and wellness.  Follow your health care provider's instructions about healthy diet, exercising, and getting tested or screened for diseases.  Follow your health care provider's instructions on monitoring your cholesterol and blood pressure. This information is not intended to replace advice given to you by your health care provider.  Make sure you discuss any questions you have with your health care provider. Document Revised: 10/21/2018 Document Reviewed: 10/21/2018 Elsevier Patient Education  2020 [employer].

## 2020-09-27 NOTE — Progress Notes (Signed)
I acted as a Neurosurgeon for [employer], PA-C Corky Mull, LPN   Subjective:    Nathaniel Daugherty is a 44 y.o. male and is here for a comprehensive physical exam.  HPI  Health Maintenance Due  Topic Date Due  . Hepatitis C Screening  Never done  . COVID-19 Vaccine (1) Never done    Acute Concerns: None  Chronic Issues: OSA on CPAP -- does not use regularly; does not like the way that it fits HTN -- Currently taking Amlodopine 10 mg, Losartan-HCTZ 50-12.5 mg, metoprolol 25 mg daily. At home blood pressure readings are: not checked. Patient denies chest pain, SOB, blurred vision, dizziness, unusual headaches, lower leg swelling. Patient is compliant with medication. Denies excessive caffeine intake, stimulant usage, excessive alcohol intake, or increase in salt consumption.  BP Readings from Last 3 Encounters:  09/27/20 (!) 150/100  09/08/20 (!) 150/98  08/07/20 (!) 140/100   Health Maintenance: Immunizations -- UTD, declines Flu, declined COVID vaccine Colonoscopy -- N/A PSA -- N/A Diet -- trying to cut back on fried foods; soda daily (Sprite or Lemonade) Caffeine intake -- none Sleep habits -- overall no concerns Exercise -- has a Scientist, research (physical sciences) at home -- trying to use daily Weight -- Weight: 228 lb 8 oz (103.6 kg)  Weight history Wt Readings from Last 10 Encounters:  09/27/20 228 lb 8 oz (103.6 kg)  09/08/20 230 lb 6.1 oz (104.5 kg)  08/07/20 221 lb 4 oz (100.4 kg)  07/05/20 210 lb 4 oz (95.4 kg)  06/30/20 212 lb (96.2 kg)  06/14/20 232 lb 9.4 oz (105.5 kg)  01/03/20 232 lb 9.6 oz (105.5 kg)  09/27/19 233 lb 6.1 oz (105.9 kg)  09/03/19 235 lb (106.6 kg)  07/01/19 233 lb (105.7 kg)   Body mass index is 34.24 kg/m. Mood -- no concerns Tobacco use --    Tobacco Use: Low Risk   . Smoking Tobacco Use: Never Smoker  . Smokeless Tobacco Use: Never Used    Alcohol use ---  reports no history of alcohol use.   Depression screen PHQ 2/9 09/27/2020  Decreased  Interest 0  Down, Depressed, Hopeless 0  PHQ - 2 Score 0   UTD with dentist and eye doctor  Other providers/specialists: Patient Care Team: Jarold Motto, [name] as PCP - General (Physician Assistant)   PMHx, SurgHx, SocialHx, Medications, and Allergies were reviewed in the Visit Navigator and updated as appropriate.   Past Medical History:  Diagnosis Date  . Hypertension   . OSA on CPAP     History reviewed. No pertinent surgical history.   Family History  Problem Relation Age of Onset  . Hypertension Mother   . Heart attack Mother   . Hypertension Father   . Cancer Maternal Grandmother   . Breast cancer Maternal Aunt   . Prostate cancer Neg Hx   . Colon cancer Neg Hx     Social History   Tobacco Use  . Smoking status: Never Smoker  . Smokeless tobacco: Never Used  Vaping Use  . Vaping Use: Never used  Substance Use Topics  . Alcohol use: No  . Drug use: No    Review of Systems:   Review of Systems  Constitutional: Negative for chills, fever, malaise/fatigue and weight loss.  HENT: Negative for hearing loss, sinus pain and sore throat.   Eyes: Negative for blurred vision.  Respiratory: Negative for cough and shortness of breath.   Cardiovascular: Negative for chest pain, palpitations and leg swelling.  Gastrointestinal: Negative for abdominal pain, constipation, diarrhea, heartburn, nausea and vomiting.  Genitourinary: Negative for dysuria, frequency and urgency.  Musculoskeletal: Negative for back pain, myalgias and neck pain.  Skin: Negative for itching and rash.  Neurological: Negative for dizziness, tingling, seizures, loss of consciousness and headaches.  Endo/Heme/Allergies: Negative for polydipsia.  Psychiatric/Behavioral: Negative for depression. The patient is not nervous/anxious.   All other systems reviewed and are negative.   Objective:   Vitals:   09/27/20 1459  BP: (!) 150/100  Pulse: 88  Temp: 98.3 F (36.8 C)  SpO2: 96%   Body  mass index is 34.24 kg/m.  General Appearance:  Alert, cooperative, no distress, appears stated age  Head:  Normocephalic, without obvious abnormality, atraumatic  Eyes:  PERRL, conjunctiva/corneas clear, EOM's intact, fundi benign, both eyes       Ears:  Normal TM's and external ear canals, both ears  Nose: Nares normal, septum midline, mucosa normal, no drainage    or sinus tenderness  Throat: Lips, mucosa, and tongue normal; teeth and gums normal  Neck: Supple, symmetrical, trachea midline, no adenopathy; thyroid:  No enlargement/tenderness/nodules; no carotit bruit or JVD  Back:   Symmetric, no curvature, ROM normal, no CVA tenderness  Lungs:   Clear to auscultation bilaterally, respirations unlabored  Chest wall:  No tenderness or deformity  Heart:  Regular rate and rhythm, S1 and S2 normal, no murmur, rub   or gallop  Abdomen:   Soft, non-tender, bowel sounds active all four quadrants, no masses, no organomegaly  Extremities: Extremities normal, atraumatic, no cyanosis or edema  Prostate: Not done.   Skin: Skin color, texture, turgor normal, no rashes or lesions  Lymph nodes: Cervical, supraclavicular, and axillary nodes normal  Neurologic: CNII-XII grossly intact. Normal strength, sensation and reflexes throughout    Assessment/Plan:   Nathaniel Daugherty was seen today for annual exam.  Diagnoses and all orders for this visit:  Routine physical examination Today patient counseled on age appropriate routine health concerns for screening and prevention, each reviewed and up to date or declined. Immunizations reviewed and up to date or declined. Labs ordered and reviewed. Risk factors for depression reviewed and negative. Hearing function and visual acuity are intact. ADLs screened and addressed as needed. Functional ability and level of safety reviewed and appropriate. Education, counseling and referrals performed based on assessed risks today. Patient provided with a copy of personalized  plan for preventive services.  Essential hypertension Uncontrolled. Continue Norvasc 10 mg daily and Toprol XL 25 mg daily. Increase Losartan-HCTZ 100-12.5 mg daily. Follow-up in 3 months, sooner if concerns.  OSA (obstructive sleep apnea) Noncompliant. Encouraged compliance.  Obesity Encouraged healthy diet and exercise as able.  Other orders -     losartan-hydrochlorothiazide (HYZAAR) 100-12.5 MG tablet; Take 1 tablet by mouth daily.    Well Adult Exam: Labs ordered: Yes. Patient counseling was done. See below for items discussed. Discussed the patient's BMI.  The BMI is not in the acceptable range; BMI management plan is completed Follow up in 3 months.  Patient Counseling: [x]   Nutrition: Stressed importance of moderation in sodium/caffeine intake, saturated fat and cholesterol, caloric balance, sufficient intake of fresh fruits, vegetables, and fiber.  [x]   Stressed the importance of regular exercise.   []   Substance Abuse: Discussed cessation/primary prevention of tobacco, alcohol, or other drug use; driving or other dangerous activities under the influence; availability of treatment for abuse.   [x]   Injury prevention: Discussed safety belts, safety helmets, smoke detector, smoking near  bedding or upholstery.   []   Sexuality: Discussed sexually transmitted diseases, partner selection, use of condoms, avoidance of unintended pregnancy  and contraceptive alternatives.   [x]   Dental health: Discussed importance of regular tooth brushing, flossing, and dental visits.  [x]   Health maintenance and immunizations reviewed. Please refer to Health maintenance section.    CMA or LPN served as scribe during this visit. History, Physical, and Plan performed by medical provider. The above documentation has been reviewed and is accurate and complete.   , PA-C [facility]

## 2020-11-26 IMAGING — DX DG CHEST 1V PORT
1 series · 1 of 1 positions shown · non-contrast
Comparison: None.

CLINICAL DATA: Pt tested positive for Covid on [REDACTED], having
increase SOB, fever and generalized body aches

EXAM:
PORTABLE CHEST 1 VIEW

[chest]
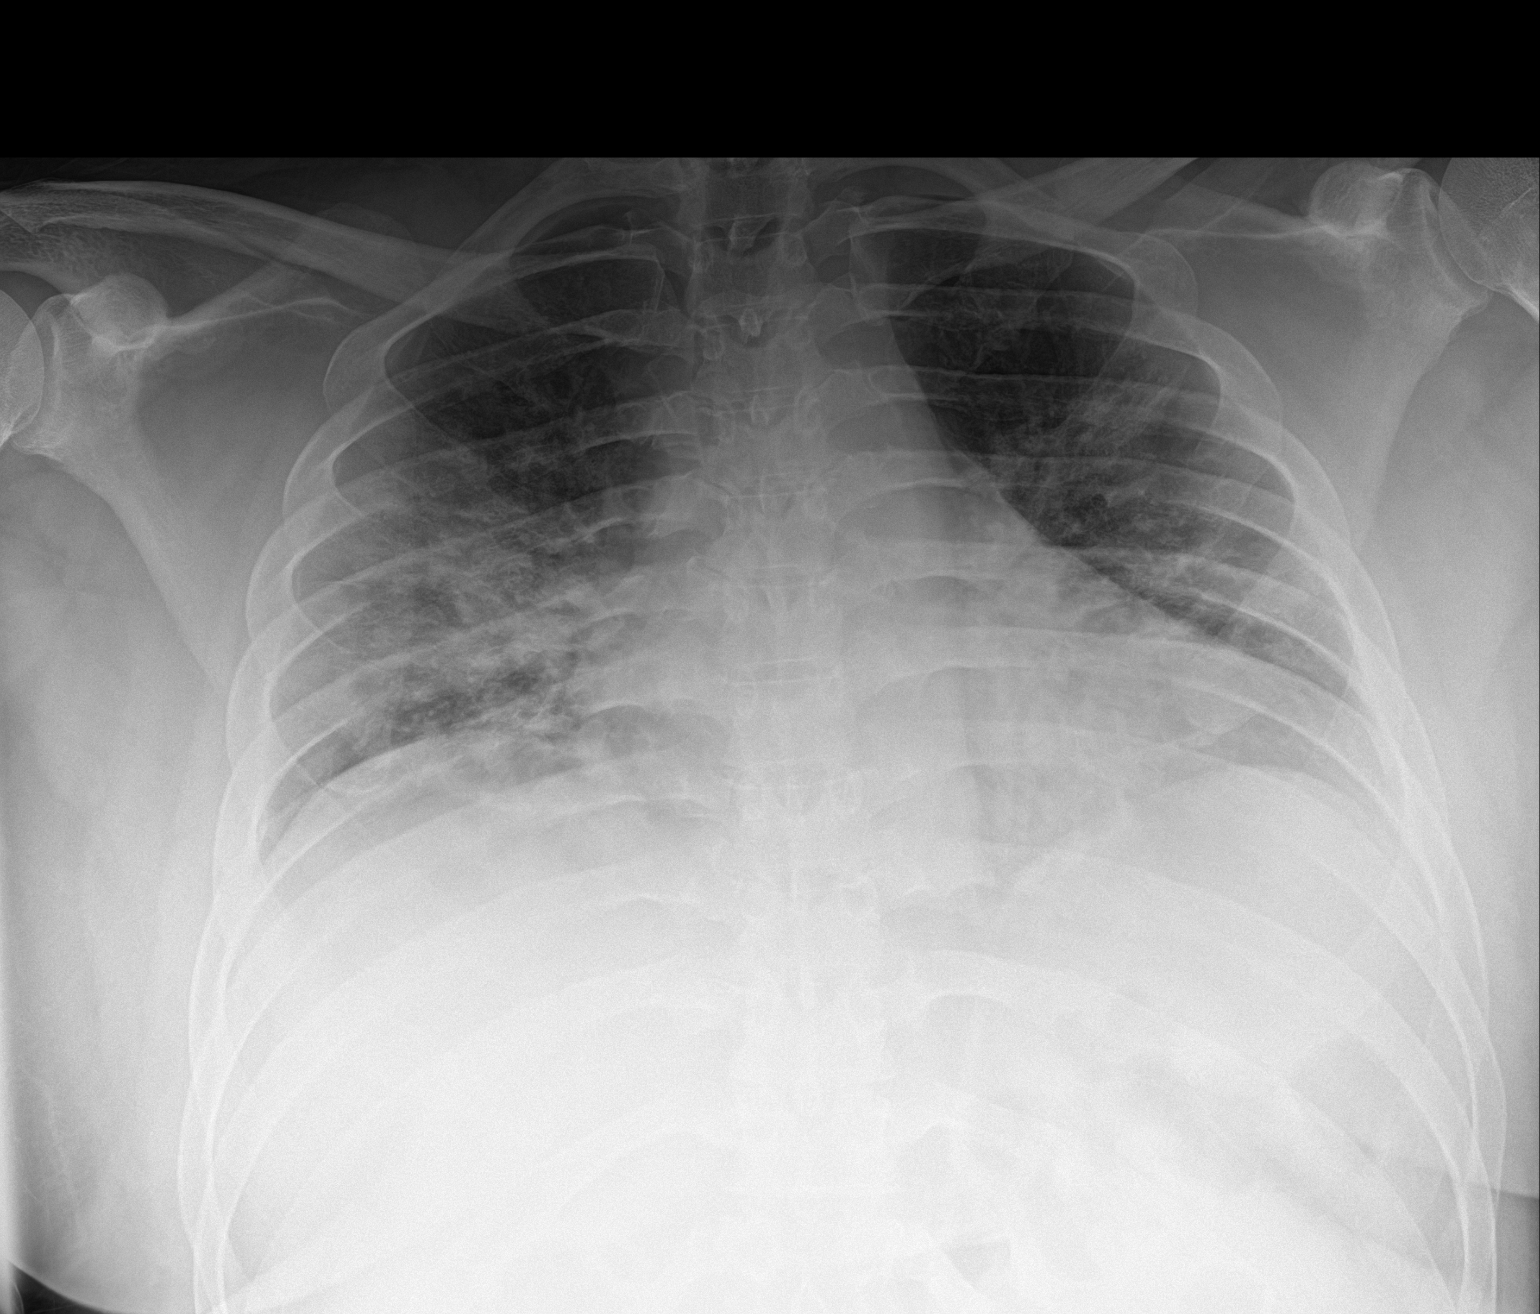

[1 of 1 positions shown; findings below may reference images not displayed]

FINDINGS: The heart size and mediastinal contours are within normal limits.
Low lung volumes. There are diffuse bilateral infiltrates. No
pneumothorax or large pleural effusion. No acute finding in the
visualized skeleton.
IMPRESSION: Diffuse bilateral infiltrates concerning for multifocal pneumonia.

## 2020-12-29 ENCOUNTER — Ambulatory Visit: Payer: PRIVATE HEALTH INSURANCE | Admitting: Physician Assistant

## 2020-12-29 DIAGNOSIS — Z0289 Encounter for other administrative examinations: Secondary | ICD-10-CM

## 2020-12-29 NOTE — Progress Notes (Deleted)
   Nathaniel Daugherty is a 45 y.o. male is here for follow up.  I acted as a Neurosurgeon for [employer], PA-C [employer], LPN   History of Present Illness:   No chief complaint on file.   HPI  Health Maintenance Due  Topic Date Due  . Hepatitis C Screening  Never done  . COVID-19 Vaccine (1) Never done    Past Medical History:  Diagnosis Date  . Hypertension   . OSA on CPAP      Social History   Tobacco Use  . Smoking status: Never Smoker  . Smokeless tobacco: Never Used  Vaping Use  . Vaping Use: Never used  Substance Use Topics  . Alcohol use: No  . Drug use: No    No past surgical history on file.  Family History  Problem Relation Age of Onset  . Hypertension Mother   . Heart attack Mother   . Hypertension Father   . Cancer Maternal Grandmother   . Breast cancer Maternal Aunt   . Prostate cancer Neg Hx   . Colon cancer Neg Hx     PMHx, SurgHx, SocialHx, FamHx, Medications, and Allergies were reviewed in the Visit Navigator and updated as appropriate.   Patient Active Problem List   Diagnosis Date Noted  . Acute hypoxemic respiratory failure due to COVID-19 (HCC) 06/14/2020  . OSA on CPAP   . Hypertension   . Insulin resistance 09/29/2019  . Hyperlipidemia 09/29/2019  . Obesity 01/18/2019  . Essential hypertension 01/18/2019    Social History   Tobacco Use  . Smoking status: Never Smoker  . Smokeless tobacco: Never Used  Vaping Use  . Vaping Use: Never used  Substance Use Topics  . Alcohol use: No  . Drug use: No    Current Medications and Allergies:    Current Outpatient Medications:  .  acetaminophen (TYLENOL) 325 MG tablet, Take 2 tablets (650 mg total) by mouth every 6 (six) hours as needed for mild pain or headache (fever >/= 101)., Disp: , Rfl:  .  amLODipine (NORVASC) 10 MG tablet, TAKE 1 TABLET BY MOUTH EVERY DAY (Patient taking differently: Take 10 mg by mouth daily. ), Disp: 90 tablet, Rfl: 1 .   losartan-hydrochlorothiazide (HYZAAR) 100-12.5 MG tablet, Take 1 tablet by mouth daily., Disp: 90 tablet, Rfl: 1 .  metoprolol succinate (TOPROL-XL) 25 MG 24 hr tablet, Take 1 tablet (25 mg total) by mouth daily., Disp: 90 tablet, Rfl: 1  No Known Allergies  Review of Systems   ROS  Vitals:  There were no vitals filed for this visit.   There is no height or weight on file to calculate BMI.   Physical Exam:    Physical Exam   Assessment and Plan:    There are no diagnoses linked to this encounter.    ***  Jarold Motto, PA-C [place] 12/29/2020  Follow-up: No follow-ups on file.

## 2021-01-02 ENCOUNTER — Encounter: Payer: Self-pay | Admitting: Physician Assistant

## 2021-03-15 ENCOUNTER — Telehealth: Payer: Self-pay

## 2021-03-15 NOTE — Telephone Encounter (Signed)
.   LAST APPOINTMENT DATE: 12/29/2020   NEXT APPOINTMENT DATE:@Visit  date not found  MEDICATION:losartan-hydrochlorothiazide (HYZAAR) 100-12.5 MG tablet metoprolol succinate (TOPROL-XL) 25 MG 24 hr tablet    PHARMACY: [id number] - [place], [place] - [address]

## 2021-03-16 ENCOUNTER — Other Ambulatory Visit: Payer: Self-pay

## 2021-03-16 MED ORDER — LOSARTAN POTASSIUM-HCTZ 100-12.5 MG PO TABS: 1.0000 | ORAL_TABLET | Freq: Every day | ORAL | 1 refills | Status: DC

## 2021-03-16 MED ORDER — METOPROLOL SUCCINATE ER 25 MG PO TB24: 25.0000 mg | ORAL_TABLET | Freq: Every day | ORAL | 1 refills | Status: DC

## 2021-03-16 NOTE — Telephone Encounter (Signed)
Rx sent in

## 2022-02-19 ENCOUNTER — Encounter: Payer: Self-pay | Admitting: Physician Assistant

## 2022-02-19 ENCOUNTER — Ambulatory Visit (INDEPENDENT_AMBULATORY_CARE_PROVIDER_SITE_OTHER): Payer: BC Managed Care – PPO | Admitting: Physician Assistant

## 2022-02-19 ENCOUNTER — Other Ambulatory Visit (HOSPITAL_COMMUNITY)
Admission: RE | Admit: 2022-02-19 | Discharge: 2022-02-19 | Disposition: A | Discharge status: Home / Self Care | Payer: BC Managed Care – PPO | Source: Ambulatory Visit | Attending: Physician Assistant | Admitting: Physician Assistant

## 2022-02-19 VITALS — BP 160/110 | HR 99 | Temp 98.4°F | Ht 68.5 in | Wt 231.6 lb

## 2022-02-19 DIAGNOSIS — Z0000 Encounter for general adult medical examination without abnormal findings: Secondary | ICD-10-CM

## 2022-02-19 DIAGNOSIS — I10 Essential (primary) hypertension: Secondary | ICD-10-CM | POA: Diagnosis not present

## 2022-02-19 DIAGNOSIS — Z113 Encounter for screening for infections with a predominantly sexual mode of transmission: Secondary | ICD-10-CM | POA: Diagnosis not present

## 2022-02-19 DIAGNOSIS — G4733 Obstructive sleep apnea (adult) (pediatric): Secondary | ICD-10-CM

## 2022-02-19 DIAGNOSIS — Z1159 Encounter for screening for other viral diseases: Secondary | ICD-10-CM

## 2022-02-19 DIAGNOSIS — E8881 Metabolic syndrome: Secondary | ICD-10-CM | POA: Diagnosis not present

## 2022-02-19 DIAGNOSIS — E669 Obesity, unspecified: Secondary | ICD-10-CM | POA: Diagnosis not present

## 2022-02-19 MED ORDER — METOPROLOL SUCCINATE ER 25 MG PO TB24: 25.0000 mg | ORAL_TABLET | Freq: Every day | ORAL | 1 refills | Status: DC

## 2022-02-19 MED ORDER — LOSARTAN POTASSIUM-HCTZ 100-12.5 MG PO TABS: 1.0000 | ORAL_TABLET | Freq: Every day | ORAL | 1 refills | Status: DC

## 2022-02-19 NOTE — Patient Instructions (Signed)
It was great to see you! ? ?Come back and see [place] in 1 month! ? ?Please go to the lab for blood work.  ? ?Our office will call you with your results unless you have chosen to receive results via MyChart. ? ?If your blood work is normal we will follow-up each year for physicals and as scheduled for chronic medical problems. ? ?If anything is abnormal we will treat accordingly and get you in for a follow-up. ? ?Take care, ? ?Aldona Bar ?  ?

## 2022-02-19 NOTE — Progress Notes (Signed)
? ?Subjective:  ?  ?Nathaniel Daugherty is a 46 y.o. male and is here for a comprehensive physical exam. ? ?HPI ? ?Health Maintenance Due  ?Topic Date Due  ? Hepatitis C Screening  Never done  ? ? ?Acute Concerns: ?STD Screening ?Despite pt not being knowingly exposed to any STDs of having any sx, he would like to have a routine STD screening completed today.  ? ?Chronic Issues: ?OSA ?Although pt was previously compliant with using his CPAP nightly, he has not been able to do so due to needing new parts for the machine. States he is in the process of making sure his new insurance will pay for this. He is interested in receiving more options to get this done quicker.  ? ?HTN ?Isaak was previously compliant with taking losartan-HCTZ 100-12.5 mg daily and Toprol XL 25 mg daily but hasn't been taking the medication for about 1-2 months due to running out of it. At home blood pressure readings are: not checked. Patient denies chest pain, SOB, blurred vision, dizziness, unusual headaches, lower leg swelling. Denies excessive caffeine intake, stimulant usage, excessive alcohol intake, or increase in salt consumption. ? ?BP Readings from Last 3 Encounters:  ?02/19/22 (!) 160/110  ?09/27/20 (!) 150/100  ?09/08/20 (!) 150/98  ? ?Hx of Insulin Resistance  ?On 06/19/20, pt was found to have an A1c of 6.4 % following routine labs. Following this discovery, pt was started on lantus 10 mg daily, but upon improvement of numbers, he has since stopped use. States that although he has not been exercising, he has been trying to make healthier dietary choices and is interested in updating these labs today. Denies hyperglycemic or hypoglycemic episodes.  ? ?Health Maintenance: ?Immunizations -- Covid- Declined ?Influenza- Declined ?Tdap- UTD;2020 ?Colonoscopy -- N/A ?Dentistry- UTD ?Ophthalmology- UTD ?PSA -- No results found for: PSA1, PSA ?Diet -- Eats all food groups-- eating once daily due to not feeling hungry ?Sleep habits -- No  concerns ?Exercise -- As able; not currently ?Weight -- Stable ?Weight history ?Wt Readings from Last 10 Encounters:  ?02/19/22 231 lb 9.6 oz (105.1 kg)  ?09/27/20 228 lb 8 oz (103.6 kg)  ?09/08/20 230 lb 6.1 oz (104.5 kg)  ?08/07/20 221 lb 4 oz (100.4 kg)  ?07/05/20 210 lb 4 oz (95.4 kg)  ?06/30/20 212 lb (96.2 kg)  ?06/14/20 232 lb 9.4 oz (105.5 kg)  ?01/03/20 232 lb 9.6 oz (105.5 kg)  ?09/27/19 233 lb 6.1 oz (105.9 kg)  ?09/03/19 235 lb (106.6 kg)  ? ?Body mass index is 34.7 kg/m?. ?Mood -- No concerns ?Tobacco use --  ?Tobacco Use: Low Risk   ? Smoking Tobacco Use: Never  ? Smokeless Tobacco Use: Never  ? Passive Exposure: Not on file  ?  ?Alcohol use ---  reports no history of alcohol use.  ? ? ?  09/27/2020  ?  2:59 PM  ?Depression screen PHQ 2/9  ?Decreased Interest 0  ?Down, Depressed, Hopeless 0  ?PHQ - 2 Score 0  ? ? ? ?Other providers/specialists: ?Patient Care Team: ?Jarold MottoWorley, Arnetia Bronk, PA as PCP - General (Physician Assistant) ? ? ?PMHx, SurgHx, SocialHx, Medications, and Allergies were reviewed in the Visit Navigator and updated as appropriate.  ? ?Past Medical History:  ?Diagnosis Date  ? Hypertension   ? OSA on CPAP   ? ? ?History reviewed. No pertinent surgical history. ? ? ?Family History  ?Problem Relation Age of Onset  ? Hypertension Mother   ? Heart attack Mother   ?  Hypertension Father   ? Cancer Maternal Grandmother   ? Breast cancer Maternal Aunt   ? Prostate cancer Neg Hx   ? Colon cancer Neg Hx   ? ? ?Social History  ? ?Tobacco Use  ? Smoking status: Never  ? Smokeless tobacco: Never  ?Vaping Use  ? Vaping Use: Never used  ?Substance Use Topics  ? Alcohol use: No  ? Drug use: No  ? ? ?Review of Systems:  ? ?ROS ?Negative unless otherwise specified per HPI. ?Objective:  ? ?Vitals:  ? 02/19/22 1506  ?BP: (!) 160/110  ?Pulse: 99  ?Temp: 98.4 ?F (36.9 ?C)  ?SpO2: 97%  ? ?Body mass index is 34.7 kg/m?. ? ?General Appearance:  Alert, cooperative, no distress, appears stated age  ?Head:   Normocephalic, without obvious abnormality, atraumatic  ?Eyes:  PERRL, conjunctiva/corneas clear, EOM's intact, fundi benign, both eyes       ?Ears:  Normal TM's and external ear canals, both ears  ?Nose: Nares normal, septum midline, mucosa normal, no drainage    or sinus tenderness  ?Throat: Lips, mucosa, and tongue normal; teeth and gums normal  ?Neck: Supple, symmetrical, trachea midline, no adenopathy; thyroid:  No enlargement/tenderness/nodules; no carotit bruit or JVD  ?Back:   Symmetric, no curvature, ROM normal, no CVA tenderness  ?Lungs:   Clear to auscultation bilaterally, respirations unlabored  ?Chest wall:  No tenderness or deformity  ?Heart:  Regular rate and rhythm, S1 and S2 normal, no murmur, rub   or gallop  ?Abdomen:   Soft, non-tender, bowel sounds active all four quadrants, no masses, no organomegaly  ?Extremities: Extremities normal, atraumatic, no cyanosis or edema  ?Prostate: Not done.   ?Skin: Skin color, texture, turgor normal, no rashes or lesions  ?Lymph nodes: Cervical, supraclavicular, and axillary nodes normal  ?Neurologic: CNII-XII grossly intact. Normal strength, sensation and reflexes throughout  ? ? ?Assessment/Plan:  ?Routine physical examination ?Today patient counseled on age appropriate routine health concerns for screening and prevention, each reviewed and up to date or declined. Immunizations reviewed and up to date or declined. Labs ordered and reviewed. Risk factors for depression reviewed and negative. Hearing function and visual acuity are intact. ADLs screened and addressed as needed. Functional ability and level of safety reviewed and appropriate. Education, counseling and referrals performed based on assessed risks today. Patient provided with a copy of personalized plan for preventive services. ? ?Essential hypertension ?Uncontrolled ?Restart losartan-HCTZ 100-12.5 mg daily and Toprol- XL 25 mg daily ?Monitor BP regularly 1-2 times a week ?I advised patient that if  BP readings are consistently >150/100, to reach out to office and medication will be adjusted ?Follow up in 1 month, sooner if concerns ? ?OSA (obstructive sleep apnea) ?Placed referral to [facility] for further discussion of equipment replacement ? ?Obesity, unspecified classification, unspecified obesity type, unspecified whether serious comorbidity present ?Encouraged patient to continue participating in healthy eating and daily exercise ? ?Screening examination for STD (sexually transmitted disease) ? - HIV Antibody  ? ?Insulin resistance ?Update HgbA1c today, will start medication as indicated by results ? ?Encounter for screening for other viral diseases ? - Hepatitis C Antibody  ? ?Patient Counseling: ?[x]   Nutrition: Stressed importance of moderation in sodium/caffeine intake, saturated fat and cholesterol, caloric balance, sufficient intake of fresh fruits, vegetables, and fiber.  ?[x]   Stressed the importance of regular exercise.   ?[]   Substance Abuse: Discussed cessation/primary prevention of tobacco, alcohol, or other drug use; driving or other dangerous activities under the influence;  availability of treatment for abuse.   ?[x]   Injury prevention: Discussed safety belts, safety helmets, smoke detector, smoking near bedding or upholstery.   ?[]   Sexuality: Discussed sexually transmitted diseases, partner selection, use of condoms, avoidance of unintended pregnancy  and contraceptive alternatives.   ?[x]   Dental health: Discussed importance of regular tooth brushing, flossing, and dental visits.  ?[x]   Health maintenance and immunizations reviewed. Please refer to Health maintenance section.  ?  ?I,Havlyn C Ratchford,acting as a scribe for , PA.,have documented all relevant documentation on the behalf of , PA,as directed by  , PA while in the presence of , [employer]. ? ?IJarold Motto, PA, have reviewed all documentation for this visit. The documentation  on 02/19/22 for the exam, diagnosis, procedures, and orders are all accurate and complete. ? ?Jarold Motto, PA-C ?[place] [facility] ? ? ? ? ? ? ? ?

## 2022-02-20 LAB — COMPREHENSIVE METABOLIC PANEL
ALT: 10 U/L (ref 0–53)
AST: 14 U/L (ref 0–37)
Albumin: 4.4 g/dL (ref 3.5–5.2)
Alkaline Phosphatase: 58 U/L (ref 39–117)
BUN: 15 mg/dL (ref 6–23)
CO2: 25 mEq/L (ref 19–32)
Calcium: 9 mg/dL (ref 8.4–10.5)
Chloride: 105 mEq/L (ref 96–112)
Creatinine, Ser: 1.25 mg/dL (ref 0.40–1.50)
GFR: 70.6 mL/min (ref >60.00)
Glucose, Bld: 85 mg/dL (ref 70–99)
Potassium: 4 mEq/L (ref 3.5–5.1)
Sodium: 139 mEq/L (ref 135–145)
Total Bilirubin: 1 mg/dL (ref 0.2–1.2)
Total Protein: 7.3 g/dL (ref 6.0–8.3)

## 2022-02-20 LAB — CBC WITH DIFFERENTIAL/PLATELET
Basophils Absolute: 0 10*3/uL (ref 0.0–0.1)
Basophils Relative: 0.6 % (ref 0.0–3.0)
Eosinophils Absolute: 0.2 10*3/uL (ref 0.0–0.7)
Eosinophils Relative: 3.2 % (ref 0.0–5.0)
HCT: 47.9 % (ref 39.0–52.0)
Hemoglobin: 15.8 g/dL (ref 13.0–17.0)
Lymphocytes Relative: 39.7 % (ref 12.0–46.0)
Lymphs Abs: 2.2 10*3/uL (ref 0.7–4.0)
MCHC: 33 g/dL (ref 30.0–36.0)
MCV: 93.8 fl (ref 78.0–100.0)
Monocytes Absolute: 0.6 10*3/uL (ref 0.1–1.0)
Monocytes Relative: 10 % (ref 3.0–12.0)
Neutro Abs: 2.6 10*3/uL (ref 1.4–7.7)
Neutrophils Relative %: 46.5 % (ref 43.0–77.0)
Platelets: 249 10*3/uL (ref 150.0–400.0)
RBC: 5.11 Mil/uL (ref 4.22–5.81)
RDW: 14 % (ref 11.5–15.5)
WBC: 5.6 10*3/uL (ref 4.0–10.5)

## 2022-02-20 LAB — HSV(HERPES SIMPLEX VRS) I + II AB-IGG
HAV 1 IGG,TYPE SPECIFIC AB: 24.7 index — ABNORMAL HIGH
HSV 2 IGG,TYPE SPECIFIC AB: <0.9 index

## 2022-02-20 LAB — HEMOGLOBIN A1C: Hgb A1c MFr Bld: 6.1 % (ref 4.6–6.5)

## 2022-02-20 LAB — HEPATITIS C ANTIBODY
Hepatitis C Ab: NONREACTIVE
SIGNAL TO CUT-OFF: 0.04 (ref <1.00)

## 2022-02-20 LAB — HIV ANTIBODY (ROUTINE TESTING W REFLEX): HIV 1&2 Ab, 4th Generation: NONREACTIVE

## 2022-02-20 LAB — RPR: RPR Ser Ql: NONREACTIVE

## 2022-02-21 ENCOUNTER — Encounter: Payer: Self-pay | Admitting: Physician Assistant

## 2022-02-21 LAB — URINE CYTOLOGY ANCILLARY ONLY
Chlamydia: NEGATIVE
Comment: NEGATIVE
Comment: NEGATIVE
Comment: NORMAL
Neisseria Gonorrhea: NEGATIVE
Trichomonas: NEGATIVE

## 2022-03-22 ENCOUNTER — Ambulatory Visit: Payer: BC Managed Care – PPO | Admitting: Physician Assistant

## 2022-03-25 ENCOUNTER — Ambulatory Visit: Payer: BC Managed Care – PPO | Admitting: Physician Assistant

## 2022-03-25 ENCOUNTER — Encounter: Payer: Self-pay | Admitting: Physician Assistant

## 2022-03-25 VITALS — BP 132/88 | HR 98 | Temp 98.3°F | Ht 68.5 in | Wt 232.4 lb

## 2022-03-25 DIAGNOSIS — I10 Essential (primary) hypertension: Secondary | ICD-10-CM

## 2022-03-25 DIAGNOSIS — B009 Herpesviral infection, unspecified: Secondary | ICD-10-CM | POA: Diagnosis not present

## 2022-03-25 NOTE — Progress Notes (Signed)
Nathaniel Daugherty is a 46 y.o. male here for a follow up of a pre-existing problem. ? ?History of Present Illness:  ? ?Chief Complaint  ?Patient presents with  ? Hypertension  ?  Pt here for f/u on blood pressure, has not been checking at home. C/o Headache today.  ? ? ?HPI ? ?HTN ?Patient is currently taking Losartan HCTZ 100-12.5 mg daily and Toprol-XL 25 mg daily. He is tolerating his medication well without any side effects. He has developed headache that has been onset since today. He has not been checking his blood pressure at home. His last blood pressure was 160/110 and this seems to be well controlled at this visit. Patient denies chest pain, SOB, blurred vision, dizziness, lower leg swelling. Denies excessive caffeine intake, stimulant usage, excessive alcohol intake, or increase in salt consumption. ? ? ?BP Readings from Last 3 Encounters:  ?03/25/22 132/88  ?02/19/22 (!) 160/110  ?09/27/20 (!) 150/100  ?  ?HSV-1 infection ?This was found at last blood draw. He has never had an outbreak. He has never tested positive for this. He has multiple questions. ? ? ?Past Medical History:  ?Diagnosis Date  ? GERD (gastroesophageal reflux disease)   ? Hypertension   ? OSA on CPAP   ? Sleep apnea   ? ?  ?Social History  ? ?Tobacco Use  ? Smoking status: Never  ? Smokeless tobacco: Never  ?Vaping Use  ? Vaping Use: Never used  ?Substance Use Topics  ? Alcohol use: No  ? Drug use: No  ? ? ?History reviewed. No pertinent surgical history. ? ?Family History  ?Problem Relation Age of Onset  ? Hypertension Mother   ? Heart attack Mother   ? Early death Mother 16  ? Hypertension Father   ? Stroke Father   ? Cancer Maternal Grandmother   ? Breast cancer Maternal Aunt   ? Breast cancer Maternal Aunt   ? Early death Maternal Aunt   ? Prostate cancer Neg Hx   ? Colon cancer Neg Hx   ? ? ?No Known Allergies ? ?Current Medications:  ? ?Current Outpatient Medications:  ?  acetaminophen (TYLENOL) 325 MG tablet, Take 2 tablets (650  mg total) by mouth every 6 (six) hours as needed for mild pain or headache (fever >/= 101)., Disp: , Rfl:  ?  losartan-hydrochlorothiazide (HYZAAR) 100-12.5 MG tablet, Take 1 tablet by mouth daily., Disp: 90 tablet, Rfl: 1 ?  metoprolol succinate (TOPROL-XL) 25 MG 24 hr tablet, Take 1 tablet (25 mg total) by mouth daily., Disp: 90 tablet, Rfl: 1  ? ?Review of Systems:  ? ?ROS ?Negative unless otherwise specified per HPI.  ? ?Vitals:  ? ?Vitals:  ? 03/25/22 1342  ?BP: 132/88  ?Pulse: 98  ?Temp: 98.3 ?F (36.8 ?C)  ?TempSrc: Temporal  ?SpO2: 94%  ?Weight: 232 lb 6.1 oz (105.4 kg)  ?Height: 5' 8.5" (1.74 m)  ?   ?Body mass index is 34.82 kg/m?. ? ?Physical Exam:  ? ?Physical Exam ?Vitals and nursing note reviewed.  ?Constitutional:   ?   General: He is not in acute distress. ?   Appearance: He is well-developed. He is not ill-appearing or toxic-appearing.  ?Cardiovascular:  ?   Rate and Rhythm: Normal rate and regular rhythm.  ?   Pulses: Normal pulses.  ?   Heart sounds: Normal heart sounds, S1 normal and S2 normal.  ?Pulmonary:  ?   Effort: Pulmonary effort is normal.  ?   Breath sounds: Normal breath sounds.  ?  Skin: ?   General: Skin is warm and dry.  ?Neurological:  ?   Mental Status: He is alert.  ?   GCS: GCS eye subscore is 4. GCS verbal subscore is 5. GCS motor subscore is 6.  ?Psychiatric:     ?   Speech: Speech normal.     ?   Behavior: Behavior normal. Behavior is cooperative.  ? ? ?Assessment and Plan:  ? ?Essential hypertension ?Normotensive ?Continue Losartan HCTZ 100-12.5 mg daily and Toprol-XL 25 mg  ?Follow-up in 6 months, sooner if concerns ? 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 ? ?Nathaniel Daugherty,acting as a Neurosurgeon for [employer], PA.,have documented all relevant documentation on the behalf of Nathaniel Motto, PA,as directed by  Nathaniel Motto, PA while in the  presence of Nathaniel Daugherty, [name].  ? ?Nathaniel Motto, PA, have reviewed all documentation for this visit. The documentation on 03/25/22 for the exam, diagnosis, procedures, and orders are all accurate and complete. ? ? ?Nathaniel Motto, PA-C ? ?

## 2022-03-25 NOTE — Patient Instructions (Addendum)
It was great to see you! ? ?Check cholesterol and update blood pressure at follow-up in 6 months ? ?Keep up the good work! ? ?Take care, ? ?Jarold Motto PA-C  ?

## 2022-09-26 ENCOUNTER — Other Ambulatory Visit (HOSPITAL_COMMUNITY)
Admission: RE | Admit: 2022-09-26 | Discharge: 2022-09-26 | Disposition: A | Discharge status: Home / Self Care | Payer: No Typology Code available for payment source | Source: Ambulatory Visit | Attending: Physician Assistant | Admitting: Physician Assistant

## 2022-09-26 ENCOUNTER — Encounter: Payer: Self-pay | Admitting: Physician Assistant

## 2022-09-26 ENCOUNTER — Ambulatory Visit: Payer: No Typology Code available for payment source | Admitting: Physician Assistant

## 2022-09-26 VITALS — BP 140/100 | HR 105 | Temp 98.0°F | Ht 68.5 in | Wt 232.4 lb

## 2022-09-26 DIAGNOSIS — I10 Essential (primary) hypertension: Secondary | ICD-10-CM

## 2022-09-26 DIAGNOSIS — Z113 Encounter for screening for infections with a predominantly sexual mode of transmission: Secondary | ICD-10-CM | POA: Insufficient documentation

## 2022-09-26 DIAGNOSIS — E7849 Other hyperlipidemia: Secondary | ICD-10-CM

## 2022-09-26 DIAGNOSIS — M542 Cervicalgia: Secondary | ICD-10-CM

## 2022-09-26 DIAGNOSIS — E88819 Insulin resistance, unspecified: Secondary | ICD-10-CM

## 2022-09-26 DIAGNOSIS — G4733 Obstructive sleep apnea (adult) (pediatric): Secondary | ICD-10-CM

## 2022-09-26 LAB — CBC WITH DIFFERENTIAL/PLATELET
Basophils Absolute: 0 10*3/uL (ref 0.0–0.1)
Basophils Relative: 0.5 % (ref 0.0–3.0)
Eosinophils Absolute: 0.1 10*3/uL (ref 0.0–0.7)
Eosinophils Relative: 1.7 % (ref 0.0–5.0)
HCT: 52.2 % — ABNORMAL HIGH (ref 39.0–52.0)
Hemoglobin: 17.5 g/dL — ABNORMAL HIGH (ref 13.0–17.0)
Lymphocytes Relative: 32.1 % (ref 12.0–46.0)
Lymphs Abs: 2.1 10*3/uL (ref 0.7–4.0)
MCHC: 33.5 g/dL (ref 30.0–36.0)
MCV: 93.7 fl (ref 78.0–100.0)
Monocytes Absolute: 0.5 10*3/uL (ref 0.1–1.0)
Monocytes Relative: 8.3 % (ref 3.0–12.0)
Neutro Abs: 3.7 10*3/uL (ref 1.4–7.7)
Neutrophils Relative %: 57.4 % (ref 43.0–77.0)
Platelets: 223 10*3/uL (ref 150.0–400.0)
RBC: 5.57 Mil/uL (ref 4.22–5.81)
RDW: 13.5 % (ref 11.5–15.5)
WBC: 6.5 10*3/uL (ref 4.0–10.5)

## 2022-09-26 LAB — COMPREHENSIVE METABOLIC PANEL
ALT: 13 U/L (ref 0–53)
AST: 15 U/L (ref 0–37)
Albumin: 4.7 g/dL (ref 3.5–5.2)
Alkaline Phosphatase: 66 U/L (ref 39–117)
BUN: 18 mg/dL (ref 6–23)
CO2: 31 mEq/L (ref 19–32)
Calcium: 10.1 mg/dL (ref 8.4–10.5)
Chloride: 99 mEq/L (ref 96–112)
Creatinine, Ser: 1.58 mg/dL — ABNORMAL HIGH (ref 0.40–1.50)
GFR: 53.07 mL/min — ABNORMAL LOW (ref >60.00)
Glucose, Bld: 92 mg/dL (ref 70–99)
Potassium: 4.4 mEq/L (ref 3.5–5.1)
Sodium: 137 mEq/L (ref 135–145)
Total Bilirubin: 1.5 mg/dL — ABNORMAL HIGH (ref 0.2–1.2)
Total Protein: 8 g/dL (ref 6.0–8.3)

## 2022-09-26 LAB — LIPID PANEL
Cholesterol: 196 mg/dL (ref 0–200)
HDL: 55.4 mg/dL (ref >39.00)
LDL Cholesterol: 127 mg/dL — ABNORMAL HIGH (ref 0–99)
NonHDL: 140.67
Total CHOL/HDL Ratio: 4
Triglycerides: 68 mg/dL (ref 0.0–149.0)
VLDL: 13.6 mg/dL (ref 0.0–40.0)

## 2022-09-26 LAB — HEMOGLOBIN A1C: Hgb A1c MFr Bld: 6.2 % (ref 4.6–6.5)

## 2022-09-26 MED ORDER — LOSARTAN POTASSIUM-HCTZ 100-25 MG PO TABS: 1.0000 | ORAL_TABLET | Freq: Every day | ORAL | 1 refills | Status: DC

## 2022-09-26 MED ORDER — METOPROLOL SUCCINATE ER 25 MG PO TB24: 25.0000 mg | ORAL_TABLET | Freq: Every day | ORAL | 1 refills | Status: DC

## 2022-09-26 MED ORDER — CYCLOBENZAPRINE HCL 10 MG PO TABS: 10.0000 mg | ORAL_TABLET | Freq: Three times a day (TID) | ORAL | 0 refills | Status: DC | PRN

## 2022-09-26 NOTE — Patient Instructions (Signed)
It was great to see you!  We will update your blood work today  Trial GoodRx for your prescriptions to make this more affordable -- please do not hesitate to contact me if your medications remain too expensive  Let's follow-up in 3 months, sooner if you have concerns.  Take care,  Jarold Motto PA-C

## 2022-09-26 NOTE — Progress Notes (Signed)
Nathaniel Daugherty is a 46 y.o. male here for a follow up of a pre-existing problem.  History of Present Illness:   Chief Complaint  Patient presents with   Hypertension    Pt has not been checking blood pressure regularly.    HPI  Hypertension Patient is currently taking Losartan HCTZ 100-12.5 mg daily and Toprol-XL 25 mg daily.  He is tolerating his medication well without any side effects.  He has not been checking his blood pressure at home regularly  His blood pressure is 140/98 at this visit, and complains of a mild headache. Patient denies chest pain, SOB, blurred vision, dizziness, lower leg swelling.  Denies excessive caffeine intake, stimulant usage, excessive alcohol intake, or increase in salt consumption.  Sleep apnea He is currently not using his CPAP machine because the nose piece on his mask has broken and he needs to replace it. Went for DLT physical, and returns in January.    Neck pain Patient reports that he has been dealing with neck pain intermittently over the past 2 weeks. The first time it happened he tried Excedrin Migraine and this helped him sleep it off and the pain resolved He has had pain for the past 2 days He denies recent upper respiratory infection, fever, chills  Hyperlipidemia and insulin resistance He is currently not on medications for either of these He would like blood work updated today  Past Medical History:  Diagnosis Date   GERD (gastroesophageal reflux disease)    Hypertension    OSA on CPAP    Sleep apnea      Social History   Tobacco Use   Smoking status: Never   Smokeless tobacco: Never  Vaping Use   Vaping Use: Never used  Substance Use Topics   Alcohol use: No   Drug use: No    History reviewed. No pertinent surgical history.  Family History  Problem Relation Age of Onset   Hypertension Mother    Heart attack Mother    Early death Mother 41   Hypertension Father    Stroke Father    Cancer Maternal  Grandmother    Breast cancer Maternal Aunt    Breast cancer Maternal Aunt    Early death Maternal Aunt    Prostate cancer Neg Hx    Colon cancer Neg Hx     No Known Allergies  Current Medications:   Current Outpatient Medications:    acetaminophen (TYLENOL) 325 MG tablet, Take 2 tablets (650 mg total) by mouth every 6 (six) hours as needed for mild pain or headache (fever >/= 101)., Disp: , Rfl:    cyclobenzaprine (FLEXERIL) 10 MG tablet, Take 1 tablet (10 mg total) by mouth 3 (three) times daily as needed for muscle spasms., Disp: 30 tablet, Rfl: 0   losartan-hydrochlorothiazide (HYZAAR) 100-25 MG tablet, Take 1 tablet by mouth daily., Disp: 90 tablet, Rfl: 1   metoprolol succinate (TOPROL-XL) 25 MG 24 hr tablet, Take 1 tablet (25 mg total) by mouth daily., Disp: 90 tablet, Rfl: 1   Review of Systems:   Review of Systems  Constitutional:  Negative for chills, fever, malaise/fatigue and weight loss.  HENT:  Negative for hearing loss, sinus pain and sore throat.   Respiratory:  Negative for cough and hemoptysis.   Cardiovascular:  Negative for chest pain, palpitations, leg swelling and PND.  Gastrointestinal:  Negative for abdominal pain, constipation, diarrhea, heartburn, nausea and vomiting.  Genitourinary:  Negative for dysuria, frequency and urgency.  Musculoskeletal:  Negative  for back pain, myalgias and neck pain.  Skin:  Negative for itching and rash.  Neurological:  Positive for headaches. Negative for dizziness, tingling and seizures.  Endo/Heme/Allergies:  Negative for polydipsia.  Psychiatric/Behavioral:  Negative for depression. The patient is not nervous/anxious.     Vitals:   Vitals:   09/26/22 0836 09/26/22 0904  BP: (!) 140/90 (!) 140/100  Pulse: (!) 105   Temp: 98 F (36.7 C)   TempSrc: Temporal   SpO2: 95%   Weight: 232 lb 6.1 oz (105.4 kg)   Height: 5' 8.5" (1.74 m)      Body mass index is 34.82 kg/m.  Physical Exam:   Physical Exam Vitals and  nursing note reviewed.  Constitutional:      General: He is not in acute distress.    Appearance: He is well-developed. He is not ill-appearing or toxic-appearing.  Cardiovascular:     Rate and Rhythm: Normal rate and regular rhythm.     Pulses: Normal pulses.     Heart sounds: Normal heart sounds, S1 normal and S2 normal.  Pulmonary:     Effort: Pulmonary effort is normal.     Breath sounds: Normal breath sounds.  Musculoskeletal:     Comments: No decreased ROM 2/2 pain with flexion/extension, lateral side bends, or rotation. Reproducible tenderness to occipital paraspinal muscles at top of cervical spine. No evidence of erythema, rash or ecchymosis.   Skin:    General: Skin is warm and dry.  Neurological:     Mental Status: He is alert.     GCS: GCS eye subscore is 4. GCS verbal subscore is 5. GCS motor subscore is 6.  Psychiatric:        Speech: Speech normal.        Behavior: Behavior normal. Behavior is cooperative.     Assessment and Plan:   Essential hypertension Above goal -no evidence of endorgan damage on exam Increase losartan hydrochlorothiazide to 100-25 mg daily and continue metoprolol 25 mg daily. Encourage patient to use good Rx for prescriptions if they are otherwise unaffordable.  If medications are still unaffordable with good Rx, I have asked patient to reach out and let us know. Continue to work on getting CPAP device repaired Update blood work today Follow-up in 3 months, sooner if concerns  OSA (obstructive sleep apnea) Uncontrolled due to broken equipment Discussed needing to get this repaired, he will work on this  Insulin resistance Update hemoglobin A1c and provide recommendations accordingly  Screening examination for STD (sexually transmitted disease) Denies known contacts or current symptoms  Neck pain No red flags Suspect neck strain Provided handout on stretches May trial 10 mg Flexeril at night as needed Follow-up if any new or  worsening symptoms  I,Moesha Myer,acting as a Neurosurgeon for [employer], PA.,have documented all relevant documentation on the behalf of Jarold Motto, PA,as directed by  Jarold Motto, PA while in the presence of Jarold Motto, [name].  I, Jarold Motto, [name], have reviewed all documentation for this visit. The documentation on 09/26/22 for the exam, diagnosis, procedures, and orders are all accurate and complete.   Jarold Motto PA-C

## 2022-09-27 ENCOUNTER — Ambulatory Visit: Payer: BC Managed Care – PPO | Admitting: Physician Assistant

## 2022-09-27 LAB — HIV ANTIBODY (ROUTINE TESTING W REFLEX): HIV 1&2 Ab, 4th Generation: NONREACTIVE

## 2022-09-27 LAB — RPR: RPR Ser Ql: NONREACTIVE

## 2022-09-27 LAB — URINE CYTOLOGY ANCILLARY ONLY
Chlamydia: NEGATIVE
Comment: NEGATIVE
Comment: NEGATIVE
Comment: NORMAL
Neisseria Gonorrhea: NEGATIVE
Trichomonas: NEGATIVE

## 2022-09-30 ENCOUNTER — Telehealth: Payer: Self-pay | Admitting: Physician Assistant

## 2022-09-30 NOTE — Telephone Encounter (Signed)
Pt states: - Returning phone call from PCP team.     Pt requests: -call back.     

## 2022-10-01 ENCOUNTER — Other Ambulatory Visit: Payer: Self-pay | Admitting: *Deleted

## 2022-10-01 MED ORDER — ATORVASTATIN CALCIUM 10 MG PO TABS: 10.0000 mg | ORAL_TABLET | Freq: Every day | ORAL | 1 refills | Status: DC

## 2022-10-01 NOTE — Telephone Encounter (Signed)
Left message on voicemail to call office.  

## 2022-10-01 NOTE — Telephone Encounter (Signed)
Pt states: - Returning phone call from PCP team.     Pt requests: -call back.

## 2022-10-01 NOTE — Telephone Encounter (Signed)
See result notes. 

## 2022-10-01 NOTE — Telephone Encounter (Signed)
Pt called again through access nurse and asking for a call back.

## 2022-11-13 ENCOUNTER — Ambulatory Visit: Payer: BC Managed Care – PPO | Admitting: Physician Assistant

## 2022-11-13 ENCOUNTER — Encounter: Payer: Self-pay | Admitting: Physician Assistant

## 2022-11-13 VITALS — BP 110/80 | HR 86 | Temp 98.2°F | Ht 68.5 in | Wt 231.2 lb

## 2022-11-13 DIAGNOSIS — M542 Cervicalgia: Secondary | ICD-10-CM

## 2022-11-13 MED ORDER — METHOCARBAMOL 500 MG PO TABS: 500.0000 mg | ORAL_TABLET | Freq: Three times a day (TID) | ORAL | 0 refills | Status: AC | PRN

## 2022-11-13 MED ORDER — PREDNISONE 20 MG PO TABS: 40.0000 mg | ORAL_TABLET | Freq: Every day | ORAL | 0 refills | Status: DC

## 2022-11-13 NOTE — Progress Notes (Signed)
Nathaniel Daugherty is a 47 y.o. male here for a follow up for pre-existing problem.  History of Present Illness:   Chief Complaint  Patient presents with   Spasms    Pt c/o continuing  muscle spasms back of neck, muscle relaxer helped some.    HPI  Neck Pain Pain has persisted x6 weeks without improvement. Posterior neck pain, radiating across trapezius muscle bilaterally R>L. Pain intermittent and "pinching" in nature. Pain is most severe with turning his head to the right. Triggered with reaching overhead and behind his back with right arm. Was taking Flexeril after last visit with me on 09/26/22 with minimal relief- only made him sleepy. Improves with taking Advil or Excedrin. Notes accompanying occasional, mild right hand weakness. Denies any pins/needles tingling in right arm.   Past Medical History:  Diagnosis Date   GERD (gastroesophageal reflux disease)    Hypertension    OSA on CPAP    Sleep apnea      Social History   Tobacco Use   Smoking status: Never   Smokeless tobacco: Never  Vaping Use   Vaping Use: Never used  Substance Use Topics   Alcohol use: No   Drug use: No    History reviewed. No pertinent surgical history.  Family History  Problem Relation Age of Onset   Hypertension Mother    Heart attack Mother    Early death Mother 52   Hypertension Father    Stroke Father    Cancer Maternal Grandmother    Breast cancer Maternal Aunt    Breast cancer Maternal Aunt    Early death Maternal Aunt    Prostate cancer Neg Hx    Colon cancer Neg Hx     No Known Allergies  Current Medications:   Current Outpatient Medications:    acetaminophen (TYLENOL) 325 MG tablet, Take 2 tablets (650 mg total) by mouth every 6 (six) hours as needed for mild pain or headache (fever >/= 101)., Disp: , Rfl:    atorvastatin (LIPITOR) 10 MG tablet, Take 1 tablet (10 mg total) by mouth daily., Disp: 90 tablet, Rfl: 1   cyclobenzaprine (FLEXERIL) 10 MG tablet, Take 1  tablet (10 mg total) by mouth 3 (three) times daily as needed for muscle spasms., Disp: 30 tablet, Rfl: 0   losartan-hydrochlorothiazide (HYZAAR) 100-25 MG tablet, Take 1 tablet by mouth daily., Disp: 90 tablet, Rfl: 1   metoprolol succinate (TOPROL-XL) 25 MG 24 hr tablet, Take 1 tablet (25 mg total) by mouth daily., Disp: 90 tablet, Rfl: 1   Review of Systems:   Review of Systems  Musculoskeletal:  Positive for myalgias and neck pain.  Neurological:  Positive for weakness. Negative for tingling.    Vitals:   Vitals:   11/13/22 1112  BP: 110/80  Pulse: 86  Temp: 98.2 F (36.8 C)  TempSrc: Temporal  SpO2: 96%  Weight: 231 lb 4 oz (104.9 kg)  Height: 5' 8.5" (1.74 m)     Body mass index is 34.65 kg/m.  Physical Exam:   Physical Exam Vitals and nursing note reviewed.  Constitutional:      Appearance: He is well-developed.  HENT:     Head: Normocephalic.  Eyes:     Conjunctiva/sclera: Conjunctivae normal.     Pupils: Pupils are equal, round, and reactive to light.  Pulmonary:     Effort: Pulmonary effort is normal.  Musculoskeletal:        General: Normal range of motion.     Cervical back:  Normal range of motion.     Comments: Decreased ROM 2/2 pain with flexion/extension, lateral side bends, or rotation. Able to move chin to chest without difficulty. Reproducible tenderness with deep palpation right superior aspect of trapezius muscle. No bony tenderness. No evidence of erythema, rash or ecchymosis.     Skin:    General: Skin is warm and dry.  Neurological:     Mental Status: He is alert and oriented to person, place, and time.  Psychiatric:        Behavior: Behavior normal.        Thought Content: Thought content normal.        Judgment: Judgment normal.     Assessment and Plan:   Neck pain Ongoing No red flags Will obtain cervical xray and start PT Recommend tylenol or excedrin migraine for pain I have also send in prednisone for his symptoms if this do  not help Trial robaxin instead of flexeril to see if he can get relief Consider sports medicine   I,Alexis Herring,acting as a scribe for Inda Coke, PA.,have documented all relevant documentation on the behalf of Inda Coke, PA,as directed by  Inda Coke, PA while in the presence of Inda Coke, [place].  I, Inda Coke, [place], have reviewed all documentation for this visit. The documentation on 11/13/22 for the exam, diagnosis, procedures, and orders are all accurate and complete.   Inda Coke, PA-C

## 2022-11-13 NOTE — Patient Instructions (Addendum)
It was great to see you!  Please start physical therapy for your neck -- you can schedule your first appointment with our office on your way out today.  An order for xray has been put in for you. To have this done, you can walk in at the [facility] location without a scheduled appointment.  The address is [address] It is across the street from [facility]. Lab and x-xray are located in the basement.  Hours of operation are M-F 8:30am to 5:00pm.  Please note that they are closed for lunch between 12:30 and 1:00pm.  Trial robaxin instead of the flexeril -- this is a different muscle relaxer  For your pain, please use over the counter tylenol or excedrin migraine.  Please avoid ibuprofen/advil/motrin.  I have also sent in oral prednisone for you to take for the pain if it gets worse or if you feel like your current medications are not helping.   Take care,  Inda Coke PA-C

## 2022-11-20 ENCOUNTER — Ambulatory Visit (INDEPENDENT_AMBULATORY_CARE_PROVIDER_SITE_OTHER)
Admission: RE | Admit: 2022-11-20 | Discharge: 2022-11-20 | Disposition: A | Discharge status: Home / Self Care | Payer: BC Managed Care – PPO | Source: Ambulatory Visit | Attending: Physician Assistant | Admitting: Physician Assistant

## 2022-11-20 DIAGNOSIS — M542 Cervicalgia: Secondary | ICD-10-CM | POA: Diagnosis not present

## 2022-12-02 NOTE — Therapy (Deleted)
OUTPATIENT PHYSICAL THERAPY CERVICAL EVALUATION   Patient Name: Nathaniel Daugherty MRN: [id number] DOB:[birth year], 47 y.o., male Today's Date: 12/02/2022  END OF SESSION:   Past Medical History:  Diagnosis Date   GERD (gastroesophageal reflux disease)    Hypertension    OSA on CPAP    Sleep apnea    No past surgical history on file. Patient Active Problem List   Diagnosis Date Noted   Acute hypoxemic respiratory failure due to COVID-19 ([place]) 06/14/2020   OSA on CPAP    Hypertension    Insulin resistance 09/29/2019   Hyperlipidemia 09/29/2019   Obesity 01/18/2019   Essential hypertension 01/18/2019    PCP: Inda Coke, PA  REFERRING PROVIDER: Inda Coke, PA  REFERRING DIAG: M54.2 (ICD-10-CM) - Neck pain  THERAPY DIAG:  No diagnosis found.  Rationale for Evaluation and Treatment: Rehabilitation  ONSET DATE: ***  SUBJECTIVE:                                                                                                                                                                                                         SUBJECTIVE STATEMENT: ***  PERTINENT HISTORY:  ***  PAIN:  Are you having pain? Yes: NPRS scale: ***/10 Pain location: *** Pain description: *** Aggravating factors: *** Relieving factors: ***  PRECAUTIONS: None  WEIGHT BEARING RESTRICTIONS: No  FALLS:  Has patient fallen in last 6 months? No  LIVING ENVIRONMENT: Lives with: {[facility]RC lives with:25569::"lives with their family"} Lives in: {Lives in:25570} Stairs: {opstairs:27293} Has following equipment at home: {Assistive devices:23999}  OCCUPATION: ***  PLOF: {PLOF:24004}  PATIENT GOALS: ***  NEXT MD VISIT:   OBJECTIVE:   DIAGNOSTIC FINDINGS:  11/21/22 xray FINDINGS: The cervical spine is visualized from C1-C6. Cervical alignment is maintained. Vertebral body heights are maintained: no evidence of acute fracture. Intervertebral spaces are maintained  without significant degenerative changes. Mild endplate proliferative changes. No prevertebral soft tissue swelling. Visualized thorax is unremarkable.   IMPRESSION: Minimal degenerative changes of the cervical spine  PATIENT SURVEYS:  {rehab surveys:24030}  COGNITION: Overall cognitive status: {cognition:24006}  SENSATION: {sensation:27233}  POSTURE: {posture:25561}  PALPATION: ***   CERVICAL ROM:   Active ROM A/PROM (deg) eval  Flexion   Extension   Right lateral flexion   Left lateral flexion   Right rotation   Left rotation    (Blank rows = not tested)    UE Measurements Upper Extremity Right 12/02/2022 Left 12/02/2022   A/PROM MMT A/PROM MMT  Shoulder Flexion      Shoulder Extension      Shoulder Abduction  Shoulder Adduction      Shoulder Internal Rotation      Shoulder External Rotation      Elbow Flexion      Elbow Extension      Wrist Flexion      Wrist Extension      Wrist Supination      Wrist Pronation      Wrist Ulnar Deviation      Wrist Radial Deviation      Grip Strength NA  NA     (Blank rows = not tested)   * pain   CERVICAL SPECIAL TESTS:  {Cervical special tests:25246}  FUNCTIONAL TESTS:  {Functional tests:24029}  TODAY'S TREATMENT:                                                                                                                              DATE: ***  12/02/2022  Therapeutic Exercise:  Aerobic: Supine: Prone:  Seated:  Standing: Neuromuscular Re-education: Manual Therapy: Therapeutic Activity: Self Care: Trigger Point Dry Needling:  Modalities:   PATIENT EDUCATION:  Education details: on current presentation, on HEP, on clinical outcomes score and POC Person educated: Patient Education method: Explanation, Demonstration, and Handouts Education comprehension: verbalized understanding   HOME EXERCISE PROGRAM: ***  ASSESSMENT:  CLINICAL IMPRESSION: Patient is a *** y.o. *** who was seen  today for physical therapy evaluation and treatment for ***.   OBJECTIVE IMPAIRMENTS: {opptimpairments:25111}.   ACTIVITY LIMITATIONS: {activitylimitations:27494}  PARTICIPATION LIMITATIONS: {participationrestrictions:25113}  PERSONAL FACTORS: {Personal factors:25162} are also affecting patient's functional outcome.   REHAB POTENTIAL: {rehabpotential:25112}  CLINICAL DECISION MAKING: {clinical decision making:25114}  EVALUATION COMPLEXITY: {Evaluation complexity:25115}   GOALS: Goals reviewed with patient? yes  SHORT TERM GOALS: Target date: {follow up:25551}  Patient will be independent in self management strategies to improve quality of life and functional outcomes. Baseline: New Program Goal status: INITIAL  2.  Patient will report at least 50% improvement in overall symptoms and/or function to demonstrate improved functional mobility Baseline: 0% better Goal status: INITIAL  3.  *** Baseline:  Goal status: INITIAL  4.  *** Baseline:  Goal status: INITIAL    LONG TERM GOALS: Target date: {follow up:25551}   Patient will report at least 75% improvement in overall symptoms and/or function to demonstrate improved functional mobility Baseline: 0% better Goal status: INITIAL  2.  Patient will improve score on FOTO outcomes measure to projected score to demonstrate overall improved function and QOL Baseline: see above Goal status: INITIAL  3.  *** Baseline:  Goal status: INITIAL  4.  *** Baseline:  Goal status: INITIAL   PLAN:  PT FREQUENCY: {rehab frequency:25116}  PT DURATION: {rehab duration:25117}  PLANNED INTERVENTIONS: Therapeutic exercises, Therapeutic activity, Neuromuscular re-education, Balance training, Gait training, Patient/Family education, Self Care, Joint mobilization, Joint manipulation, Vestibular training, Orthotic/Fit training, Aquatic Therapy, Dry Needling, Electrical stimulation, Spinal manipulation, Spinal mobilization, Cryotherapy,  Moist heat, Ultrasound, Ionotophoresis 65m/ml Dexamethasone, Manual therapy, and Re-evaluation  PLAN FOR  NEXT SESSION: ***   1:32 PM, 12/02/22 Jerene Pitch, DPT Physical Therapy with [place]

## 2022-12-03 ENCOUNTER — Ambulatory Visit: Payer: BC Managed Care – PPO | Admitting: Physical Therapy

## 2023-01-06 ENCOUNTER — Ambulatory Visit: Payer: No Typology Code available for payment source | Admitting: Physician Assistant

## 2023-01-15 ENCOUNTER — Telehealth: Payer: Self-pay | Admitting: Physician Assistant

## 2023-01-15 MED ORDER — METOPROLOL SUCCINATE ER 25 MG PO TB24: 25.0000 mg | ORAL_TABLET | Freq: Every day | ORAL | 0 refills | Status: AC

## 2023-01-15 MED ORDER — ATORVASTATIN CALCIUM 10 MG PO TABS: 10.0000 mg | ORAL_TABLET | Freq: Every day | ORAL | 0 refills | Status: DC

## 2023-01-15 MED ORDER — LOSARTAN POTASSIUM-HCTZ 100-25 MG PO TABS: 1.0000 | ORAL_TABLET | Freq: Every day | ORAL | 0 refills | Status: DC

## 2023-01-15 NOTE — Telephone Encounter (Signed)
Spoke to pt asked him how many pills does he need? Pt said 10-15. Asked how long are you going to be out of town? Pt said about a month. Told him I will send in 30 day supply of each,but you may have to pay out of pocket if not time for refill. If so look on Good Rx for coupons. Pt verbalized understanding. Rx's sent to pharmacy as requested.

## 2023-01-15 NOTE — Telephone Encounter (Signed)
Pt went out of town and forgot his BP medications. Patient would like the following to be sent to [facility] in [address] [place] [phone] phone [phone]    1) atorvastatin (LIPITOR) 10 MG tablet    2) metoprolol succinate (TOPROL-XL) 25 MG 24 hr tablet    3) losartan-hydrochlorothiazide (HYZAAR) 100-25 MG tablet

## 2023-01-20 ENCOUNTER — Ambulatory Visit: Payer: BC Managed Care – PPO | Admitting: Physician Assistant

## 2023-01-21 ENCOUNTER — Encounter: Payer: Self-pay | Admitting: Physician Assistant

## 2023-01-21 ENCOUNTER — Ambulatory Visit (INDEPENDENT_AMBULATORY_CARE_PROVIDER_SITE_OTHER): Payer: No Typology Code available for payment source | Admitting: Physician Assistant

## 2023-01-21 VITALS — BP 110/78 | HR 83 | Temp 97.7°F | Ht 68.5 in | Wt 236.2 lb

## 2023-01-21 DIAGNOSIS — I10 Essential (primary) hypertension: Secondary | ICD-10-CM | POA: Diagnosis not present

## 2023-01-21 NOTE — Progress Notes (Signed)
Nathaniel Daugherty is a 47 y.o. male here for a new problem.  History of Present Illness:   No chief complaint on file.   HPI  Hypertension Treated with losartan-hydrochlorothiazide 100-25 mg daily, Toprol-XL 25 mg daily.   Past Medical History:  Diagnosis Date   GERD (gastroesophageal reflux disease)    Hypertension    OSA on CPAP    Sleep apnea      Social History   Tobacco Use   Smoking status: Never   Smokeless tobacco: Never  Vaping Use   Vaping Use: Never used  Substance Use Topics   Alcohol use: No   Drug use: No    No past surgical history on file.  Family History  Problem Relation Age of Onset   Hypertension Mother    Heart attack Mother    Early death Mother 48   Hypertension Father    Stroke Father    Cancer Maternal Grandmother    Breast cancer Maternal Aunt    Breast cancer Maternal Aunt    Early death Maternal Aunt    Prostate cancer Neg Hx    Colon cancer Neg Hx     No Known Allergies  Current Medications:   Current Outpatient Medications:    acetaminophen (TYLENOL) 325 MG tablet, Take 2 tablets (650 mg total) by mouth every 6 (six) hours as needed for mild pain or headache (fever >/= 101)., Disp: , Rfl:    atorvastatin (LIPITOR) 10 MG tablet, Take 1 tablet (10 mg total) by mouth daily., Disp: 30 tablet, Rfl: 0   losartan-hydrochlorothiazide (HYZAAR) 100-25 MG tablet, Take 1 tablet by mouth daily., Disp: 30 tablet, Rfl: 0   methocarbamol (ROBAXIN) 500 MG tablet, Take 1 tablet (500 mg total) by mouth every 8 (eight) hours as needed for muscle spasms., Disp: 30 tablet, Rfl: 0   metoprolol succinate (TOPROL-XL) 25 MG 24 hr tablet, Take 1 tablet (25 mg total) by mouth daily., Disp: 30 tablet, Rfl: 0   Review of Systems:   ROS  Vitals:   There were no vitals filed for this visit.   There is no height or weight on file to calculate BMI.  Physical Exam:   Physical Exam  Assessment and Plan:   ***   I,Alexander Ruley,acting as a  scribe for Inda Coke, PA.,have documented all relevant documentation on the behalf of Inda Coke, PA,as directed by  Inda Coke, PA while in the presence of Inda Coke, [place].   ***   Inda Coke, PA-C

## 2023-01-21 NOTE — Progress Notes (Signed)
Nathaniel Daugherty is a 47 y.o. male here for a follow up of a pre-existing problem.  History of Present Illness:   Chief Complaint  Patient presents with   Hypertension    Pt is here for blood pressure check due to being high at work. He needs 3 blood pressure checks and note for work.       Last Monday was in a orientation for his job and they checked his blood pressure and it was very elevated. He had not taken his blood pressure medication that day.  They are requiring 3 normal readings for his blood pressure in order to return to work. He went to urgent care yesterday and had BP reading of 138/84  HTN Currently taking losartan-hctz 100-25 mg and metoprolol 25 mg daily. At home blood pressure readings are: 117/79, 124/83. Patient denies chest pain, SOB, blurred vision, dizziness, unusual headaches, lower leg swelling. Patient is compliant with medication. Denies excessive caffeine intake, stimulant usage, excessive alcohol intake, or increase in salt consumption.  BP Readings from Last 3 Encounters:  01/21/23 110/78  11/13/22 110/80  09/26/22 (!) 140/100     Past Medical History:  Diagnosis Date   GERD (gastroesophageal reflux disease)    Hypertension    OSA on CPAP    Sleep apnea      Social History   Tobacco Use   Smoking status: Never   Smokeless tobacco: Never  Vaping Use   Vaping Use: Never used  Substance Use Topics   Alcohol use: No   Drug use: No    History reviewed. No pertinent surgical history.  Family History  Problem Relation Age of Onset   Hypertension Mother    Heart attack Mother    Early death Mother 18   Hypertension Father    Stroke Father    Cancer Maternal Grandmother    Breast cancer Maternal Aunt    Breast cancer Maternal Aunt    Early death Maternal Aunt    Prostate cancer Neg Hx    Colon cancer Neg Hx     No Known Allergies  Current Medications:   Current Outpatient Medications:    acetaminophen (TYLENOL) 325 MG tablet,  Take 2 tablets (650 mg total) by mouth every 6 (six) hours as needed for mild pain or headache (fever >/= 101)., Disp: , Rfl:    atorvastatin (LIPITOR) 10 MG tablet, Take 1 tablet (10 mg total) by mouth daily., Disp: 30 tablet, Rfl: 0   losartan-hydrochlorothiazide (HYZAAR) 100-25 MG tablet, Take 1 tablet by mouth daily., Disp: 30 tablet, Rfl: 0   methocarbamol (ROBAXIN) 500 MG tablet, Take 1 tablet (500 mg total) by mouth every 8 (eight) hours as needed for muscle spasms., Disp: 30 tablet, Rfl: 0   metoprolol succinate (TOPROL-XL) 25 MG 24 hr tablet, Take 1 tablet (25 mg total) by mouth daily., Disp: 30 tablet, Rfl: 0   Review of Systems:   ROS Negative unless otherwise specified per HPI.   Vitals:   Vitals:   01/21/23 0827  BP: 110/78  Pulse: 83  Temp: 97.7 F (36.5 C)  TempSrc: Temporal  SpO2: 98%  Weight: 236 lb 4 oz (107.2 kg)  Height: 5' 8.5" (1.74 m)     Body mass index is 35.4 kg/m.  Physical Exam:   Physical Exam Vitals and nursing note reviewed.  Constitutional:      General: He is not in acute distress.    Appearance: He is well-developed. He is not ill-appearing or toxic-appearing.  Cardiovascular:     Rate and Rhythm: Normal rate and regular rhythm.     Pulses: Normal pulses.     Heart sounds: Normal heart sounds, S1 normal and S2 normal.  Pulmonary:     Effort: Pulmonary effort is normal.     Breath sounds: Normal breath sounds.  Skin:    General: Skin is warm and dry.  Neurological:     Mental Status: He is alert.     GCS: GCS eye subscore is 4. GCS verbal subscore is 5. GCS motor subscore is 6.  Psychiatric:        Speech: Speech normal.        Behavior: Behavior normal. Behavior is cooperative.     Assessment and Plan:   Essential hypertension No red flags Blood pressure is normal in office today Continue losartan-hctz 100-25 mg and metoprolol xl 25 mg daily Follow-up tomorrow to review blood pressure, per his employer's recommendation I  have sent in work note for patient  Inda Coke, PA-C

## 2023-01-21 NOTE — Patient Instructions (Signed)
It was great to see you!  Blood pressure is great  I have emailed the letter as you requested  Follow-up for repeat BP tomorrow  Take care,  Inda Coke PA-C

## 2023-01-22 ENCOUNTER — Encounter: Payer: Self-pay | Admitting: Physician Assistant

## 2023-01-22 ENCOUNTER — Ambulatory Visit (INDEPENDENT_AMBULATORY_CARE_PROVIDER_SITE_OTHER): Payer: No Typology Code available for payment source | Admitting: Physician Assistant

## 2023-01-22 VITALS — BP 120/84 | HR 76 | Temp 97.8°F | Ht 68.5 in | Wt 236.2 lb

## 2023-01-22 DIAGNOSIS — I10 Essential (primary) hypertension: Secondary | ICD-10-CM | POA: Diagnosis not present

## 2023-02-06 ENCOUNTER — Other Ambulatory Visit: Payer: Self-pay | Admitting: Physician Assistant

## 2023-02-17 ENCOUNTER — Encounter: Payer: No Typology Code available for payment source | Admitting: Physician Assistant

## 2023-03-30 ENCOUNTER — Other Ambulatory Visit: Payer: Self-pay | Admitting: Physician Assistant
